# Patient Record
Sex: Female | Born: 1970 | Race: White | Hispanic: No | Marital: Married | Smoking: Never smoker
Health system: Southern US, Community
[De-identification: ages and names within clinical notes are randomized; demographics above are authoritative.]

## PROBLEM LIST (undated history)

## (undated) DIAGNOSIS — B977 Papillomavirus as the cause of diseases classified elsewhere: Secondary | ICD-10-CM

## (undated) DIAGNOSIS — A499 Bacterial infection, unspecified: Secondary | ICD-10-CM

## (undated) DIAGNOSIS — A630 Anogenital (venereal) warts: Secondary | ICD-10-CM

## (undated) DIAGNOSIS — O09529 Supervision of elderly multigravida, unspecified trimester: Secondary | ICD-10-CM

## (undated) DIAGNOSIS — N979 Female infertility, unspecified: Secondary | ICD-10-CM

## (undated) DIAGNOSIS — B379 Candidiasis, unspecified: Secondary | ICD-10-CM

## (undated) DIAGNOSIS — Z8619 Personal history of other infectious and parasitic diseases: Secondary | ICD-10-CM

## (undated) DIAGNOSIS — IMO0002 Reserved for concepts with insufficient information to code with codable children: Secondary | ICD-10-CM

## (undated) DIAGNOSIS — Z87440 Personal history of urinary (tract) infections: Secondary | ICD-10-CM

## (undated) HISTORY — DX: Female infertility, unspecified: N97.9

## (undated) HISTORY — DX: Reserved for concepts with insufficient information to code with codable children: IMO0002

## (undated) HISTORY — DX: Supervision of elderly multigravida, unspecified trimester: O09.529

## (undated) HISTORY — PX: LEEP: SHX91

## (undated) HISTORY — DX: Candidiasis, unspecified: B37.9

## (undated) HISTORY — PX: WISDOM TOOTH EXTRACTION: SHX21

## (undated) HISTORY — PX: DILATION AND CURETTAGE OF UTERUS: SHX78

## (undated) HISTORY — DX: Personal history of other infectious and parasitic diseases: Z86.19

## (undated) HISTORY — DX: Papillomavirus as the cause of diseases classified elsewhere: B97.7

## (undated) HISTORY — DX: Bacterial infection, unspecified: A49.9

## (undated) HISTORY — DX: Personal history of urinary (tract) infections: Z87.440

## (undated) HISTORY — DX: Anogenital (venereal) warts: A63.0

---

## 1989-11-19 DIAGNOSIS — IMO0002 Reserved for concepts with insufficient information to code with codable children: Secondary | ICD-10-CM

## 1989-11-19 DIAGNOSIS — R87619 Unspecified abnormal cytological findings in specimens from cervix uteri: Secondary | ICD-10-CM

## 1989-11-19 HISTORY — DX: Unspecified abnormal cytological findings in specimens from cervix uteri: R87.619

## 1989-11-19 HISTORY — DX: Reserved for concepts with insufficient information to code with codable children: IMO0002

## 1998-06-01 ENCOUNTER — Ambulatory Visit (HOSPITAL_COMMUNITY): Admission: RE | Admit: 1998-06-01 | Discharge: 1998-06-01 | Payer: Self-pay | Admitting: Orthopedic Surgery

## 2002-12-22 ENCOUNTER — Other Ambulatory Visit: Admission: RE | Admit: 2002-12-22 | Discharge: 2002-12-22 | Payer: Self-pay | Admitting: Obstetrics and Gynecology

## 2007-11-09 ENCOUNTER — Inpatient Hospital Stay (HOSPITAL_COMMUNITY): Admission: AD | Admit: 2007-11-09 | Discharge: 2007-11-12 | Payer: Self-pay | Admitting: Obstetrics

## 2008-03-30 ENCOUNTER — Emergency Department (HOSPITAL_COMMUNITY): Admission: EM | Admit: 2008-03-30 | Discharge: 2008-03-30 | Payer: Self-pay | Admitting: Emergency Medicine

## 2009-11-10 ENCOUNTER — Ambulatory Visit: Payer: Self-pay | Admitting: Obstetrics and Gynecology

## 2009-12-05 ENCOUNTER — Ambulatory Visit: Payer: Self-pay | Admitting: Obstetrics and Gynecology

## 2010-02-01 ENCOUNTER — Ambulatory Visit: Payer: Self-pay | Admitting: Obstetrics and Gynecology

## 2010-02-23 ENCOUNTER — Ambulatory Visit: Payer: Self-pay | Admitting: Obstetrics and Gynecology

## 2010-02-28 ENCOUNTER — Ambulatory Visit: Payer: Self-pay | Admitting: Obstetrics and Gynecology

## 2010-03-03 ENCOUNTER — Ambulatory Visit: Payer: Self-pay | Admitting: Gynecology

## 2010-03-27 ENCOUNTER — Ambulatory Visit: Payer: Self-pay | Admitting: Obstetrics and Gynecology

## 2010-03-30 ENCOUNTER — Ambulatory Visit: Payer: Self-pay | Admitting: Obstetrics and Gynecology

## 2010-09-28 ENCOUNTER — Ambulatory Visit: Payer: Self-pay | Admitting: Women's Health

## 2010-12-10 ENCOUNTER — Encounter: Payer: Self-pay | Admitting: Internal Medicine

## 2011-04-03 NOTE — Op Note (Signed)
[name] [name], [name] [name]                ACCOUNT NO.:  [id number]   MEDICAL RECORD NO.:  [id number]          PATIENT TYPE:  INP   LOCATION:  9167                          FACILITY:  [facility]   PHYSICIAN:  Maxie Better, M.D.DATE OF BIRTH:  03-Feb-1971   DATE OF PROCEDURE:  11/09/2007  DATE OF DISCHARGE:                               OPERATIVE REPORT   PREOPERATIVE DIAGNOSES:  1. Fetal intolerance to labor.  2. Nonreassuring tracing.   PROCEDURE:  Urgent primary cesarean section, Kerr hysterotomy.   POSTOPERATIVE DIAGNOSES:  1. Fetal intolerance to labor.  2. Nonreassuring fetal tracing.   ANESTHESIA:  Epidural.   SURGEON:  Maxie Better, M.D.   ASSISTANT:  Marlinda Mike, C.N.M.   PROCEDURE:  Under adequate epidural anesthesia, the patient was placed  in the supine position with a left lateral tilt.  She was sterilely  prepped and draped in the usual fashion.  An indwelling Foley catheter  was already in place.  The patient had 0.25% Marcaine injected along the  planned Pfannenstiel skin incision site.  A Pfannenstiel skin incision  was then made, carried down to the rectus fascia.  The rectus fascia was  opened transversely.  Rectus fascia then bluntly and sharply dissected  off the rectus muscle in superior and inferior fashion.  The rectus  muscles split in the midline.  The parietal peritoneum was entered  bluntly and extended.  A well-developed lower uterine segment was noted.  The vesicouterine peritoneum was opened transversely.  The bladder was  then bluntly dissected off the lower uterine segment and displaced  inferiorly with the bladder retractor.  A curvilinear low transverse  uterine incision was then made and extended with bandage scissors.  Subsequent delivery of a live female from the left occiput posterior  position was accomplished with a cord around the neck x2, which was  subsequently reduced.  The cord was clamped and cut and the baby was  transferred to the  awaiting pediatrician, who assigned Apgars of 8 and 9  at one and five minutes.  Placenta was spontaneous intact and not sent.  Uterine cavity was cleaned of debris.  Uterine incision had no  extension.  Uterine incision was closed in two layers, the first layer  of 0 Monocryl running locked stitch, the second layer was imbricated  using 0 Monocryl suture.  Good hemostasis noted.  The patient was a  private cord blood bank donor and the cord blood for banking was  obtained following the direction of the package from the patient.  Normal tubes and ovaries were noted bilaterally.  A small subserosal  about 2-cm anterior fibroid was noted.  The abdomen was copiously  irrigated and suctioned of debris.  The parietal peritoneum was closed  with 2-0 Vicryl, the rectus fascia was closed with 0 Vicryl x2.  The  subcutaneous area was irrigated and small bleeders cauterized.  Interrupted 2-0 plain sutures were then placed in the subcutaneous area  and the skin approximated using Ethicon staples.   SPECIMENS:  Placenta, not sent.   ESTIMATED BLOOD LOSS:  500 mL.   INTRAOPERATIVE FLUID:  1500 mL.   URINE OUTPUT:  100 mL clear yellow urine.   Sponge and instrument counts x2 was correct.  Cord pH was obtained,  which was 7.28.  Weight of the baby was 6 pounds 6 ounces.  The patient  tolerated procedure well and was transferred to the recovery room in  stable condition.      Maxie Better, M.D.  Electronically Signed     [place]/MEDQ  D:  11/09/2007  T:  11/10/2007  Job:  [id number]

## 2011-04-06 NOTE — Discharge Summary (Signed)
[name] [name], [name]                ACCOUNT NO.:  [id number]   MEDICAL RECORD NO.:  [id number]          PATIENT TYPE:  INP   LOCATION:  9128                          FACILITY:  [facility]   PHYSICIAN:  Maxie Better, M.D.DATE OF BIRTH:  Aug 03, 1971   DATE OF ADMISSION:  11/09/2007  DATE OF DISCHARGE:  11/12/2007                               DISCHARGE SUMMARY   ADMISSION DIAGNOSES:  1. Spontaneous rupture of membranes.  2. Term gestation.   DISCHARGE DIAGNOSES:  1. Term gestation, delivered.  2. Fetal intolerance to labor.  3. Nonreassuring fetal tracing.  4. Subserosal fibroid.   PROCEDURE:  Primary cesarean section.Marland Kitchen   HOSPITAL COURSE:  The patient was admitted as a 40 year old gravida 3,  para 0-0-2-0 female at 79 weeks with spontaneous rupture of membranes.  On presentation the patient's cervix was fingertip, 40%, vertex  presentation.  Ultrasound performed revealed a vertex, oligohydramnios.  Group B strep culture was negative.  The patient was having some  moderate variable deceleration down to the 80s and contractions were  every 15 minutes.  Amnioinfusion was planned, as well as attempted  induction of labor.  The patient was transferred to labor and delivery.  She was placed on continuous monitoring.  During the course of her labor  the patient continued to have some variable decelerations.  Amnioinfusion was ultimately started.  Pitocin augmentation was done.  Intrauterine pressure catheter had been placed as well as internal scalp  electrode.  Despite several attempts at Pitocin augmentation,  decelerations were noted which did not allow for labor to advance.  Decision was made to proceed with a primary cesarean section.  Primary  cesarean section was performed for nonreassuring fetal tracing.  Procedure resulted in delivery of a live female from the left occiput  posterior presentation weighing 6 pounds 6 ounces with a cord around the  neck x2, Apgars of 8 and 9.  The  patient had an uncomplicated  postoperative course.  By postoperative day #3 she was tolerating a  regular diet, had passed flatus.  Incision had no evidence of infection.  Her CBC on postoperative day #1 showed a hemoglobin of 11.0; hematocrit  31.1; white count of 9.7; platelet count of 215,000.  She was deemed  well to be discharged home.   DISPOSITION:  Home.   CONDITION:  Stable.   DISCHARGE MEDICATIONS:  1. Percocet one to two tablets every 4-6 hours p.r.n. pain.  2. Prenatal vitamins one p.o. daily.   DISCHARGE INSTRUCTIONS:  Per the postpartum booklet given.   FOLLOWUP APPOINTMENT:  At [facility] in 6 weeks.      Maxie Better, M.D.  Electronically Signed     [place]/MEDQ  D:  12/14/2007  T:  12/14/2007  Job:  [id number]

## 2011-05-12 ENCOUNTER — Inpatient Hospital Stay (HOSPITAL_COMMUNITY)
Admission: AD | Admit: 2011-05-12 | Discharge: 2011-05-12 | Disposition: A | Discharge status: Home / Self Care | Payer: Medicaid Other | Source: Ambulatory Visit | Attending: Obstetrics and Gynecology | Admitting: Obstetrics and Gynecology

## 2011-05-12 DIAGNOSIS — O479 False labor, unspecified: Secondary | ICD-10-CM | POA: Insufficient documentation

## 2011-05-24 ENCOUNTER — Inpatient Hospital Stay (HOSPITAL_COMMUNITY)
Admission: AD | Admit: 2011-05-24 | Discharge: 2011-05-25 | DRG: 775 | Disposition: A | Discharge status: Home / Self Care | Payer: Medicaid Other | Source: Ambulatory Visit | Attending: Obstetrics and Gynecology | Admitting: Obstetrics and Gynecology

## 2011-05-24 ENCOUNTER — Inpatient Hospital Stay (HOSPITAL_COMMUNITY)
Admission: AD | Admit: 2011-05-24 | Payer: Medicaid Other | Source: Ambulatory Visit | Admitting: Obstetrics and Gynecology

## 2011-05-24 DIAGNOSIS — O34219 Maternal care for unspecified type scar from previous cesarean delivery: Secondary | ICD-10-CM | POA: Diagnosis present

## 2011-05-24 LAB — CBC
Hemoglobin: 12.7 g/dL (ref 12.0–15.0)
MCHC: 34.2 g/dL (ref 30.0–36.0)
RDW: 13.7 % (ref 11.5–15.5)

## 2011-05-24 LAB — RPR: RPR Ser Ql: NONREACTIVE

## 2011-05-25 LAB — CBC
HCT: 30.8 % — ABNORMAL LOW (ref 36.0–46.0)
Hemoglobin: 10.1 g/dL — ABNORMAL LOW (ref 12.0–15.0)
MCHC: 32.8 g/dL (ref 30.0–36.0)
MCV: 91.9 fL (ref 78.0–100.0)
RDW: 14.1 % (ref 11.5–15.5)

## 2011-08-24 LAB — CBC
HCT: 31.1 — ABNORMAL LOW
Hemoglobin: 12.5
MCHC: 35.5
MCV: 92.5
MCV: 92.6
Platelets: 215
RBC: 3.79 — ABNORMAL LOW
RDW: 12.9
RDW: 13.4

## 2011-08-24 LAB — RPR: RPR Ser Ql: NONREACTIVE

## 2011-11-20 NOTE — L&D Delivery Note (Signed)
Delivery Note At 3:34 PM a viable female was delivered via Vaginal, in L side lying position. Spontaneous Delivery (Presentation: ROA, Occiput Anterior).  Easy delivery of the head, loose nuchal cord x 1 slipped, easly delivery of the shoulders,bulb suction mouth and nares, baby placed on pt abdomen cord doubly clamped and cut. APGAR: 7, 7; weight pending. Placenta status: Intact, Spontaneous, schultze.  Cord: 3 vessels  Anesthesia: Epidural  Episiotomy: None Lacerations: 2nd degree;Perineal Suture Repair: 3.0 monocryl Est. Blood Loss (mL): 200  Mom to postpartum.  Baby to rooming in.  Deette Revak 08/21/2012, 4:16 PM

## 2012-01-02 ENCOUNTER — Other Ambulatory Visit: Payer: Self-pay | Admitting: Obstetrics and Gynecology

## 2012-01-02 DIAGNOSIS — O09529 Supervision of elderly multigravida, unspecified trimester: Secondary | ICD-10-CM

## 2012-01-02 DIAGNOSIS — O3680X0 Pregnancy with inconclusive fetal viability, not applicable or unspecified: Secondary | ICD-10-CM

## 2012-01-08 ENCOUNTER — Encounter (HOSPITAL_COMMUNITY): Payer: Self-pay | Admitting: Obstetrics and Gynecology

## 2012-01-16 ENCOUNTER — Encounter (HOSPITAL_COMMUNITY): Payer: Self-pay

## 2012-01-16 ENCOUNTER — Ambulatory Visit (HOSPITAL_COMMUNITY)
Admission: RE | Admit: 2012-01-16 | Discharge: 2012-01-16 | Disposition: A | Discharge status: Home / Self Care | Payer: Medicaid Other | Source: Ambulatory Visit | Attending: Obstetrics and Gynecology | Admitting: Obstetrics and Gynecology

## 2012-01-16 DIAGNOSIS — O3680X0 Pregnancy with inconclusive fetal viability, not applicable or unspecified: Secondary | ICD-10-CM

## 2012-01-16 DIAGNOSIS — O09529 Supervision of elderly multigravida, unspecified trimester: Secondary | ICD-10-CM

## 2012-01-16 DIAGNOSIS — O3440 Maternal care for other abnormalities of cervix, unspecified trimester: Secondary | ICD-10-CM | POA: Insufficient documentation

## 2012-01-16 DIAGNOSIS — O34219 Maternal care for unspecified type scar from previous cesarean delivery: Secondary | ICD-10-CM | POA: Insufficient documentation

## 2012-01-16 NOTE — Progress Notes (Signed)
Genetic Counseling  High-Risk Gestation Note  Appointment Date:  01/16/2012 Referred By: Hal Morales, MD Date of Birth:  June 02, 1971  Pregnancy History: [id number] Estimated Date of Delivery: 08/31/12 Estimated Gestational Age: [redacted]w[redacted]d Attending: Particia Nearing, MD   [name] was seen for genetic counseling because of a maternal age of 41 y.o..     She was counseled regarding maternal age and the association with risk for chromosome conditions due to nondisjunction with aging of the ova.   We reviewed chromosomes, nondisjunction, and the associated 1 in 16 risk for fetal aneuploidy related to a maternal age of 41 y.o. at [redacted]w[redacted]d gestation.  She was counseled that the risk for aneuploidy decreases as gestational age increases, accounting for those pregnancies which spontaneously abort.  We specifically discussed Down syndrome (trisomy 4), trisomies 42 and 9, and sex chromosome aneuploidies (47,XXX and 47,XXY) including the common features and prognoses of each.   We reviewed available screening and diagnostic options.  Regarding screening tests, we discussed the options of First screen, Quad screen and ultrasound.  In addition, we discussed another type of screening test, noninvasive prenatal testing (NIPT), which utilizes cell free fetal DNA found in the maternal circulation. This test is not diagnostic for chromosome conditions, but can provide information regarding the presence or absence of extra fetal DNA for chromosomes 13, 18 and 21. Thus, it would not identify or rule out all fetal aneuploidy. The reported detection rate is greater than 99% for Trisomy 21, greater than 97% for Trisomy 18, and is approximately 80% (8 out of 10) for Trisomy 13. The false positive rate is thought to be less than 1% for any of these conditions.  She understands that screening tests are used to modify a patient's a priori risk for aneuploidy, typically based on age.  This estimate provides a pregnancy  specific risk assessment.  We also reviewed the availability of diagnostic options including CVS and amniocentesis.  We discussed the risks, limitations, and benefits of each.  After thoughtful consideration of these options, [name] [name] was undecided regarding her options.  She was provided with brochures regarding these options.  She agreed to contact our office when she has made a decision so that she can be scheduled appropriately.  [name] [name] was provided with written information regarding cystic fibrosis (CF) including the carrier frequency and incidence in the Caucasian population, the availability of carrier testing and prenatal diagnosis if indicated.  In addition, we discussed that CF is routinely screened for as part of the [place] newborn screening panel.  She declined testing today.   Both family histories were reviewed and found to be noncontributory for birth defects, mental retardation, and known genetic conditions. Without further information regarding the provided family history, an accurate genetic risk cannot be calculated. Further genetic counseling is warranted if more information is obtained.  [name] [name] denied exposure to environmental toxins or chemical agents. She denied the use of alcohol, tobacco or street drugs. She denied significant viral illnesses during the course of her pregnancy. Her medical and surgical histories were noncontributory.   I counseled [name] [name] regarding the above risks and available options.  The approximate face-to-face time with the genetic counselor was 55 minutes.  [name] Prose, MS  Certified Genetic Counselor

## 2012-01-25 ENCOUNTER — Other Ambulatory Visit (HOSPITAL_COMMUNITY): Payer: Self-pay | Admitting: Obstetrics and Gynecology

## 2012-01-25 DIAGNOSIS — Z3682 Encounter for antenatal screening for nuchal translucency: Secondary | ICD-10-CM

## 2012-02-05 ENCOUNTER — Encounter (INDEPENDENT_AMBULATORY_CARE_PROVIDER_SITE_OTHER): Payer: Medicaid Other | Admitting: Obstetrics and Gynecology

## 2012-02-05 DIAGNOSIS — O3441 Maternal care for other abnormalities of cervix, first trimester: Secondary | ICD-10-CM | POA: Insufficient documentation

## 2012-02-05 DIAGNOSIS — Z0000 Encounter for general adult medical examination without abnormal findings: Secondary | ICD-10-CM

## 2012-02-05 DIAGNOSIS — Z8742 Personal history of other diseases of the female genital tract: Secondary | ICD-10-CM | POA: Insufficient documentation

## 2012-02-05 DIAGNOSIS — O039 Complete or unspecified spontaneous abortion without complication: Secondary | ICD-10-CM | POA: Insufficient documentation

## 2012-02-05 DIAGNOSIS — Z98891 History of uterine scar from previous surgery: Secondary | ICD-10-CM | POA: Insufficient documentation

## 2012-02-05 DIAGNOSIS — Z331 Pregnant state, incidental: Secondary | ICD-10-CM

## 2012-02-05 DIAGNOSIS — N898 Other specified noninflammatory disorders of vagina: Secondary | ICD-10-CM

## 2012-02-05 LAB — OB RESULTS CONSOLE HEPATITIS B SURFACE ANTIGEN: Hepatitis B Surface Ag: NEGATIVE

## 2012-02-05 LAB — OB RESULTS CONSOLE ABO/RH: RH Type: POSITIVE

## 2012-02-05 LAB — OB RESULTS CONSOLE RPR: RPR: NONREACTIVE

## 2012-02-05 LAB — OB RESULTS CONSOLE HIV ANTIBODY (ROUTINE TESTING): HIV: NONREACTIVE

## 2012-02-05 LAB — OB RESULTS CONSOLE GC/CHLAMYDIA: Gonorrhea: NEGATIVE

## 2012-02-05 LAB — OB RESULTS CONSOLE RUBELLA ANTIBODY, IGM: Rubella: IMMUNE

## 2012-02-12 ENCOUNTER — Other Ambulatory Visit: Payer: Self-pay

## 2012-02-13 ENCOUNTER — Ambulatory Visit: Payer: Medicaid Other | Attending: Obstetrics and Gynecology

## 2012-02-13 DIAGNOSIS — M542 Cervicalgia: Secondary | ICD-10-CM | POA: Insufficient documentation

## 2012-02-13 DIAGNOSIS — R5381 Other malaise: Secondary | ICD-10-CM | POA: Insufficient documentation

## 2012-02-13 DIAGNOSIS — IMO0001 Reserved for inherently not codable concepts without codable children: Secondary | ICD-10-CM | POA: Insufficient documentation

## 2012-02-13 DIAGNOSIS — M25559 Pain in unspecified hip: Secondary | ICD-10-CM | POA: Insufficient documentation

## 2012-02-14 ENCOUNTER — Ambulatory Visit (HOSPITAL_COMMUNITY)
Admission: RE | Admit: 2012-02-14 | Discharge: 2012-02-14 | Disposition: A | Discharge status: Home / Self Care | Payer: Medicaid Other | Source: Ambulatory Visit | Attending: Obstetrics and Gynecology | Admitting: Obstetrics and Gynecology

## 2012-02-14 DIAGNOSIS — O09529 Supervision of elderly multigravida, unspecified trimester: Secondary | ICD-10-CM | POA: Insufficient documentation

## 2012-02-14 DIAGNOSIS — O3510X0 Maternal care for (suspected) chromosomal abnormality in fetus, unspecified, not applicable or unspecified: Secondary | ICD-10-CM | POA: Insufficient documentation

## 2012-02-14 DIAGNOSIS — O3440 Maternal care for other abnormalities of cervix, unspecified trimester: Secondary | ICD-10-CM | POA: Insufficient documentation

## 2012-02-14 DIAGNOSIS — O34219 Maternal care for unspecified type scar from previous cesarean delivery: Secondary | ICD-10-CM | POA: Insufficient documentation

## 2012-02-14 DIAGNOSIS — Z3682 Encounter for antenatal screening for nuchal translucency: Secondary | ICD-10-CM

## 2012-02-14 DIAGNOSIS — Z3689 Encounter for other specified antenatal screening: Secondary | ICD-10-CM | POA: Insufficient documentation

## 2012-02-14 DIAGNOSIS — O351XX0 Maternal care for (suspected) chromosomal abnormality in fetus, not applicable or unspecified: Secondary | ICD-10-CM | POA: Insufficient documentation

## 2012-02-17 ENCOUNTER — Telehealth: Payer: Self-pay | Admitting: Obstetrics and Gynecology

## 2012-02-17 DIAGNOSIS — O09529 Supervision of elderly multigravida, unspecified trimester: Secondary | ICD-10-CM | POA: Insufficient documentation

## 2012-02-17 NOTE — Telephone Encounter (Signed)
12 weeks, onset N/V this am.  Ate normal meal at home last night, no one else is sick. No diarrhea at present, no fever.  Keeping fluids down.  Plan: Recommend CTO, with NPO except for fluids, chips/sips. Call if symptoms don't abate as the day progresses or if symptoms worsen. Reassured patient regarding safety of baby. Offered eval in MAU as needed.  Nigel Bridgeman, CNM, [place] 02/17/12 11am

## 2012-02-20 ENCOUNTER — Ambulatory Visit: Payer: Medicaid Other | Attending: Obstetrics and Gynecology | Admitting: Physical Therapy

## 2012-02-20 DIAGNOSIS — M542 Cervicalgia: Secondary | ICD-10-CM | POA: Insufficient documentation

## 2012-02-20 DIAGNOSIS — R5381 Other malaise: Secondary | ICD-10-CM | POA: Insufficient documentation

## 2012-02-20 DIAGNOSIS — M25559 Pain in unspecified hip: Secondary | ICD-10-CM | POA: Insufficient documentation

## 2012-02-20 DIAGNOSIS — IMO0001 Reserved for inherently not codable concepts without codable children: Secondary | ICD-10-CM | POA: Insufficient documentation

## 2012-02-27 ENCOUNTER — Telehealth: Payer: Self-pay

## 2012-02-27 ENCOUNTER — Telehealth (HOSPITAL_COMMUNITY): Payer: Self-pay

## 2012-02-27 NOTE — Telephone Encounter (Signed)
Left message for patient to return call regarding MaterniT21 results. Donald Prose, MS Certified Genetic Counselor

## 2012-02-27 NOTE — Telephone Encounter (Signed)
Tc to pt rgd sch colpo

## 2012-02-28 ENCOUNTER — Ambulatory Visit: Payer: Medicaid Other | Admitting: Physical Therapy

## 2012-03-04 ENCOUNTER — Encounter: Payer: Medicaid Other | Admitting: Obstetrics and Gynecology

## 2012-03-05 ENCOUNTER — Encounter: Payer: Medicaid Other | Admitting: Physical Therapy

## 2012-03-06 ENCOUNTER — Encounter: Payer: Medicaid Other | Admitting: Obstetrics and Gynecology

## 2012-03-06 NOTE — Telephone Encounter (Signed)
Called [name] [name] to discuss her MaterniT21, cell free fetal DNA testing.  We reviewed that these are within normal limits, showing an expected representation of chromosomes 21, 18 and 13 as well as Y chromosome material, consistent with a female fetus.  We reviewed that this testing identifies > 99% of pregnancies with trisomy 66 and trisomy 19, and >91.7% with trisomy 85; the false positive rate is <1% for all conditions.  She understands that this testing does not identify all genetic conditions.  All questions were answered to her satisfaction, she was encouraged to call with additional questions or concerns.  Despina Arias, MS Certified Genetic Counselor

## 2012-03-11 ENCOUNTER — Encounter: Payer: Self-pay | Admitting: Obstetrics and Gynecology

## 2012-03-11 ENCOUNTER — Ambulatory Visit (INDEPENDENT_AMBULATORY_CARE_PROVIDER_SITE_OTHER): Payer: Medicaid Other | Admitting: Obstetrics and Gynecology

## 2012-03-11 VITALS — BP 100/58 | Wt 177.0 lb

## 2012-03-11 DIAGNOSIS — Z331 Pregnant state, incidental: Secondary | ICD-10-CM

## 2012-03-11 NOTE — Progress Notes (Signed)
Leeroy Bock [redacted]w[redacted]d   Doing well No ctx, VB or LOF Pt receiving PT for hip and shoulder pain, plan 1x/week for 5weeks per consult note Pt reports relief of pan with PT - c/o L hip pain and L shoulder pain Pt denies any nausea/vomiting, admits to increased activity taking care of 2 children wgt loss to date 10lbs,  Plan:  RV'd pap results, needs colpo LGSIL, HPV/CIN 1 Pt had normal harmony aneuploidy testing, received after genetic counceling  Anat [place] supplement with boost/ensure daily

## 2012-03-11 NOTE — Patient Instructions (Signed)
High Protein Diet A high protein diet means that high protein foods are added to your diet. Getting more protein in the diet is important for a number of reasons. Protein helps the body to build tissue, muscle, and to repair damage. People who have had surgery, injuries such as broken bones, infections, and burns, or illnesses such as cancer, may need more protein in their diet.  SERVING SIZES Measuring foods and serving sizes helps to make sure you are getting the right amount of food. The list below tells how big or small some common serving sizes are.   1 oz.........4 stacked dice.   3 oz.........Deck of cards.   1 tsp........Tip of little finger.   1 tbs........Thumb.   2 tbs........Golf ball.    cup.......Half of a fist.   1 cup........A fist.  FOOD SOURCES OF PROTEIN Listed below are some food sources of protein and the amount of protein they contain. Your Registered Dietitian can calculate how many grams of protein you need for your medical condition. High protein foods can be added to the diet at mealtime or as snacks. Be sure to have at least 1 protein-containing food at each meal and snack to ensure adequate intake.  Meats and Meat Substitutes / Protein (g)  3 oz poultry (chicken, turkey) / 26 g   3 oz tuna, canned in water / 26 g   3 oz fish (cod) / 21 g   3 oz red meat (beef, pork) / 21 g   4 oz tofu / 9 g   1 egg / 6 g    cup egg substitute / 5 g   1 cup dried beans / 15 g   1 cup soy milk / 4 g  Dairy / Protein (g)  1 cup milk (skim, 1%, 2%, whole) / 8 g    cup evaporated milk / 9 g   1 cup buttermilk / 8 g   1 cup low-fat plain yogurt / 11 g   1 cup regular plain yogurt / 9 g    cup cottage cheese / 14 g   1 oz cheddar cheese / 7 g  Nuts / Protein (g)  2 tbs peanut butter / 8 g   1 oz peanuts / 7 g   2 tbs cashews / 5 g   2 tbs almonds / 5 g  Document Released: 11/05/2005 Document Revised: 10/25/2011 Document Reviewed:  08/08/2007 [employer] Patient Information 2012 [employer]. 

## 2012-04-07 DIAGNOSIS — Z98891 History of uterine scar from previous surgery: Secondary | ICD-10-CM

## 2012-04-07 DIAGNOSIS — O3441 Maternal care for other abnormalities of cervix, first trimester: Secondary | ICD-10-CM

## 2012-04-07 DIAGNOSIS — O039 Complete or unspecified spontaneous abortion without complication: Secondary | ICD-10-CM

## 2012-04-07 DIAGNOSIS — Z8742 Personal history of other diseases of the female genital tract: Secondary | ICD-10-CM

## 2012-04-08 ENCOUNTER — Ambulatory Visit (INDEPENDENT_AMBULATORY_CARE_PROVIDER_SITE_OTHER): Payer: Medicaid Other

## 2012-04-08 ENCOUNTER — Ambulatory Visit (INDEPENDENT_AMBULATORY_CARE_PROVIDER_SITE_OTHER): Payer: Medicaid Other | Admitting: Obstetrics and Gynecology

## 2012-04-08 ENCOUNTER — Other Ambulatory Visit: Payer: Self-pay | Admitting: Obstetrics and Gynecology

## 2012-04-08 ENCOUNTER — Encounter: Payer: Self-pay | Admitting: Obstetrics and Gynecology

## 2012-04-08 VITALS — BP 118/60 | Ht 66.0 in | Wt 180.0 lb

## 2012-04-08 DIAGNOSIS — Z3689 Encounter for other specified antenatal screening: Secondary | ICD-10-CM

## 2012-04-08 DIAGNOSIS — Z331 Pregnant state, incidental: Secondary | ICD-10-CM

## 2012-04-08 DIAGNOSIS — Z3490 Encounter for supervision of normal pregnancy, unspecified, unspecified trimester: Secondary | ICD-10-CM

## 2012-04-08 DIAGNOSIS — O09529 Supervision of elderly multigravida, unspecified trimester: Secondary | ICD-10-CM

## 2012-04-09 NOTE — Progress Notes (Signed)
Pt left the office after [place] but before being seen by MD Maternity 21 screen neg Ultrasound shows:  SIUP  S=D     [place] EDD: 09/01/12            AFI: nl           Cervical length: 4.8 cm           Placenta localization: posterior           Fetal presentation: variable                   Anatomy survey is normal but incomplete.  Non visualization on LVOT an d4 chamber heart           Gender : female

## 2012-04-22 ENCOUNTER — Telehealth: Payer: Self-pay | Admitting: Obstetrics and Gynecology

## 2012-04-22 NOTE — Telephone Encounter (Signed)
cht found/at check in

## 2012-04-23 ENCOUNTER — Ambulatory Visit (INDEPENDENT_AMBULATORY_CARE_PROVIDER_SITE_OTHER): Payer: Medicaid Other | Admitting: Obstetrics and Gynecology

## 2012-04-23 ENCOUNTER — Encounter: Payer: Self-pay | Admitting: Obstetrics and Gynecology

## 2012-04-23 VITALS — BP 100/60 | Ht 66.0 in | Wt 181.5 lb

## 2012-04-23 DIAGNOSIS — O09529 Supervision of elderly multigravida, unspecified trimester: Secondary | ICD-10-CM

## 2012-04-23 NOTE — Progress Notes (Signed)
Pt voices no complaints today. 1)concerned about lack of weight gain.  Total weight gain this pregnancy 3 pound.  Size equal to dates. Plan repeat ultrasound in third trimester if bleeding remains poor 2) one hour Glucola next visit

## 2012-05-09 LAB — US OB COMP + 14 WK

## 2012-06-04 ENCOUNTER — Other Ambulatory Visit: Payer: Medicaid Other

## 2012-06-04 ENCOUNTER — Encounter: Payer: Medicaid Other | Admitting: Obstetrics and Gynecology

## 2012-06-06 ENCOUNTER — Encounter: Payer: Self-pay | Admitting: Obstetrics and Gynecology

## 2012-06-06 ENCOUNTER — Other Ambulatory Visit: Payer: Medicaid Other

## 2012-06-06 ENCOUNTER — Ambulatory Visit (INDEPENDENT_AMBULATORY_CARE_PROVIDER_SITE_OTHER): Payer: Medicaid Other | Admitting: Obstetrics and Gynecology

## 2012-06-06 VITALS — BP 98/62 | Wt 190.0 lb

## 2012-06-06 DIAGNOSIS — Z331 Pregnant state, incidental: Secondary | ICD-10-CM

## 2012-06-06 DIAGNOSIS — Z3490 Encounter for supervision of normal pregnancy, unspecified, unspecified trimester: Secondary | ICD-10-CM

## 2012-06-06 LAB — HEMOGLOBIN: Hemoglobin: 11.7 g/dL — ABNORMAL LOW (ref 12.0–15.0)

## 2012-06-06 NOTE — Progress Notes (Signed)
1 gtt given today without difficulty  Pt excited about her wt gain

## 2012-06-06 NOTE — Progress Notes (Signed)
[redacted]w[redacted]d No complaints 1 hr GTT today. ROB x4 weeks

## 2012-06-07 LAB — GLUCOSE TOLERANCE, 1 HOUR (50G) W/O FASTING: Glucose, 1 Hour GTT: 101 mg/dL (ref 70–140)

## 2012-06-07 LAB — RPR

## 2012-06-20 ENCOUNTER — Encounter: Payer: Medicaid Other | Admitting: Obstetrics and Gynecology

## 2012-06-24 ENCOUNTER — Ambulatory Visit (INDEPENDENT_AMBULATORY_CARE_PROVIDER_SITE_OTHER): Payer: Medicaid Other | Admitting: Obstetrics and Gynecology

## 2012-06-24 VITALS — BP 98/68 | Wt 190.0 lb

## 2012-06-24 DIAGNOSIS — Z3490 Encounter for supervision of normal pregnancy, unspecified, unspecified trimester: Secondary | ICD-10-CM

## 2012-06-24 DIAGNOSIS — Z331 Pregnant state, incidental: Secondary | ICD-10-CM

## 2012-06-24 DIAGNOSIS — T148XXA Other injury of unspecified body region, initial encounter: Secondary | ICD-10-CM

## 2012-06-24 DIAGNOSIS — W57XXXA Bitten or stung by nonvenomous insect and other nonvenomous arthropods, initial encounter: Secondary | ICD-10-CM | POA: Insufficient documentation

## 2012-06-24 HISTORY — DX: Bitten or stung by nonvenomous insect and other nonvenomous arthropods, initial encounter: W57.XXXA

## 2012-06-24 NOTE — Progress Notes (Signed)
Pt bitten by deer tick 1st wk in July no bullseye  06/06/12 1 gtt 101 Hemoglobin 11.7 RPR NR

## 2012-06-24 NOTE — Progress Notes (Signed)
[redacted]w[redacted]d No complaints. Had been bitten by a deer tick 4 weeks ago. Would like to be tested for Lime Disease. Bloods for Lime Disease Testing today. ROB x 2 weeks.

## 2012-07-02 ENCOUNTER — Telehealth: Payer: Self-pay | Admitting: Obstetrics and Gynecology

## 2012-07-02 NOTE — Telephone Encounter (Signed)
Spoke with patient and advised of Negative result of Lyme's Disease Titer. Earl Gala, CNM.

## 2012-07-14 ENCOUNTER — Encounter: Payer: Self-pay | Admitting: Obstetrics and Gynecology

## 2012-07-14 ENCOUNTER — Ambulatory Visit (INDEPENDENT_AMBULATORY_CARE_PROVIDER_SITE_OTHER): Payer: Medicaid Other | Admitting: Obstetrics and Gynecology

## 2012-07-14 VITALS — BP 90/62 | Wt 193.0 lb

## 2012-07-14 DIAGNOSIS — O26849 Uterine size-date discrepancy, unspecified trimester: Secondary | ICD-10-CM

## 2012-07-14 DIAGNOSIS — Z3490 Encounter for supervision of normal pregnancy, unspecified, unspecified trimester: Secondary | ICD-10-CM

## 2012-07-14 DIAGNOSIS — Z331 Pregnant state, incidental: Secondary | ICD-10-CM

## 2012-07-14 NOTE — Addendum Note (Signed)
Addended by: Janeece Agee on: 07/14/2012 11:02 AM   Modules accepted: Orders

## 2012-07-14 NOTE — Progress Notes (Signed)
No concerns per pt 

## 2012-07-14 NOTE — Progress Notes (Signed)
Lyme Dx testing negative Concerned regarding size of baby, feels bigger than previous pregnancy S>D--plan [place] at [place]. GBS NV. Vtx to [place] today. Needs VBAC consent [place].

## 2012-07-29 ENCOUNTER — Ambulatory Visit (INDEPENDENT_AMBULATORY_CARE_PROVIDER_SITE_OTHER): Payer: Medicaid Other | Admitting: Obstetrics and Gynecology

## 2012-07-29 ENCOUNTER — Ambulatory Visit (INDEPENDENT_AMBULATORY_CARE_PROVIDER_SITE_OTHER): Payer: Medicaid Other

## 2012-07-29 VITALS — BP 102/64 | Wt 195.0 lb

## 2012-07-29 DIAGNOSIS — Z331 Pregnant state, incidental: Secondary | ICD-10-CM

## 2012-07-29 DIAGNOSIS — O26849 Uterine size-date discrepancy, unspecified trimester: Secondary | ICD-10-CM

## 2012-07-29 LAB — US OB FOLLOW UP

## 2012-07-29 NOTE — Progress Notes (Signed)
U/S: Growth. [redacted]w[redacted]d EFW 6lbs 11oz +/- oz AUA 74%tile LMP 85%tile.  Vertex presentation. Anterior placenta Upper normal fluid. AFI = 80th%tile. EFW = 85th%tile.  No late presenting fetal abnormality. Cx- Not measured.   Pt states she has no concerns today.  GBS today.

## 2012-07-29 NOTE — Progress Notes (Signed)
Patient ID: [name] [name], female   DOB: 1971-10-25, 41 y.o.   MRN: [id number] [redacted]w[redacted]d GBS sent Reviewed s/s uc, srom, vag bleeding, daily fetal kick counts to report, comfort measures. Encouragged 8 water daily and frequent voids. Lavera Guise, CNM

## 2012-07-31 LAB — STREP B DNA PROBE: GBSP: NEGATIVE

## 2012-08-08 ENCOUNTER — Ambulatory Visit (INDEPENDENT_AMBULATORY_CARE_PROVIDER_SITE_OTHER): Payer: Medicaid Other | Admitting: Obstetrics and Gynecology

## 2012-08-08 VITALS — BP 120/64 | Wt 198.0 lb

## 2012-08-08 DIAGNOSIS — Z331 Pregnant state, incidental: Secondary | ICD-10-CM

## 2012-08-08 DIAGNOSIS — Z3490 Encounter for supervision of normal pregnancy, unspecified, unspecified trimester: Secondary | ICD-10-CM

## 2012-08-08 NOTE — Progress Notes (Signed)
Office Visit on 07/29/2012  Component Date Value Range Status  . GBSP 07/29/2012 NEGATIVE   Final  No complaints Doing well FKCs and Labor precautions RTO 1wk

## 2012-08-14 ENCOUNTER — Encounter: Payer: Self-pay | Admitting: Obstetrics and Gynecology

## 2012-08-14 ENCOUNTER — Ambulatory Visit (INDEPENDENT_AMBULATORY_CARE_PROVIDER_SITE_OTHER): Payer: Medicaid Other | Admitting: Obstetrics and Gynecology

## 2012-08-14 VITALS — BP 98/68 | Wt 198.0 lb

## 2012-08-14 DIAGNOSIS — Z331 Pregnant state, incidental: Secondary | ICD-10-CM

## 2012-08-14 NOTE — Progress Notes (Signed)
Patient ID: [name] [name], female   DOB: 06/28/1971, 41 y.o.   MRN: [id number] [redacted]w[redacted]d GBS neg Reviewed s/s uc, srom, vag bleeding, daily fetal kick counts to report, comfort measures. Encouragged 8 water daily and frequent voids. Lavera Guise, CNM

## 2012-08-14 NOTE — Progress Notes (Signed)
Pt requests cervix check today.  

## 2012-08-21 ENCOUNTER — Encounter (HOSPITAL_COMMUNITY): Payer: Self-pay | Admitting: Anesthesiology

## 2012-08-21 ENCOUNTER — Inpatient Hospital Stay (HOSPITAL_COMMUNITY): Payer: Medicaid Other | Admitting: Anesthesiology

## 2012-08-21 ENCOUNTER — Encounter (HOSPITAL_COMMUNITY): Payer: Self-pay | Admitting: *Deleted

## 2012-08-21 ENCOUNTER — Inpatient Hospital Stay (HOSPITAL_COMMUNITY)
Admission: AD | Admit: 2012-08-21 | Discharge: 2012-08-23 | DRG: 775 | Disposition: A | Discharge status: Home / Self Care | Payer: Medicaid Other | Source: Ambulatory Visit | Attending: Obstetrics and Gynecology | Admitting: Obstetrics and Gynecology

## 2012-08-21 DIAGNOSIS — O09529 Supervision of elderly multigravida, unspecified trimester: Secondary | ICD-10-CM | POA: Diagnosis present

## 2012-08-21 DIAGNOSIS — IMO0001 Reserved for inherently not codable concepts without codable children: Secondary | ICD-10-CM

## 2012-08-21 DIAGNOSIS — O34219 Maternal care for unspecified type scar from previous cesarean delivery: Secondary | ICD-10-CM | POA: Diagnosis present

## 2012-08-21 DIAGNOSIS — D649 Anemia, unspecified: Secondary | ICD-10-CM | POA: Diagnosis not present

## 2012-08-21 DIAGNOSIS — O9903 Anemia complicating the puerperium: Secondary | ICD-10-CM | POA: Diagnosis not present

## 2012-08-21 LAB — TYPE AND SCREEN: Antibody Screen: NEGATIVE

## 2012-08-21 LAB — CBC
HCT: 37.3 % (ref 36.0–46.0)
Hemoglobin: 12.3 g/dL (ref 12.0–15.0)
MCV: 90.3 fL (ref 78.0–100.0)
WBC: 12 10*3/uL — ABNORMAL HIGH (ref 4.0–10.5)

## 2012-08-21 MED ORDER — ZOLPIDEM TARTRATE 5 MG PO TABS: 5.0000 mg | ORAL_TABLET | Freq: Every evening | ORAL | Status: DC | PRN

## 2012-08-21 MED ORDER — ACETAMINOPHEN 325 MG PO TABS: 650.0000 mg | ORAL_TABLET | ORAL | Status: DC | PRN

## 2012-08-21 MED ORDER — OXYCODONE-ACETAMINOPHEN 5-325 MG PO TABS: 1.0000 | ORAL_TABLET | ORAL | Status: DC | PRN

## 2012-08-21 MED ORDER — TETANUS-DIPHTH-ACELL PERTUSSIS 5-2.5-18.5 LF-MCG/0.5 IM SUSP
0.5000 mL | Freq: Once | INTRAMUSCULAR | Status: AC
  Administered 2012-08-22: 0.5 mL via INTRAMUSCULAR
  Filled 2012-08-21: qty 0.5

## 2012-08-21 MED ORDER — LACTATED RINGERS IV SOLN: 500.0000 mL | INTRAVENOUS | Status: DC | PRN

## 2012-08-21 MED ORDER — LACTATED RINGERS IV SOLN
500.0000 mL | Freq: Once | INTRAVENOUS | Status: AC
  Administered 2012-08-21: 500 mL via INTRAVENOUS

## 2012-08-21 MED ORDER — EPHEDRINE 5 MG/ML INJ: 10.0000 mg | INTRAVENOUS | Status: DC | PRN

## 2012-08-21 MED ORDER — EPHEDRINE 5 MG/ML INJ
10.0000 mg | INTRAVENOUS | Status: DC | PRN
  Filled 2012-08-21: qty 4

## 2012-08-21 MED ORDER — FENTANYL 2.5 MCG/ML BUPIVACAINE 1/10 % EPIDURAL INFUSION (WH - ANES)
14.0000 mL/h | INTRAMUSCULAR | Status: DC
  Administered 2012-08-21: 14 mL/h via EPIDURAL
  Filled 2012-08-21: qty 123

## 2012-08-21 MED ORDER — ONDANSETRON HCL 4 MG/2ML IJ SOLN: 4.0000 mg | Freq: Four times a day (QID) | INTRAMUSCULAR | Status: DC | PRN

## 2012-08-21 MED ORDER — LANOLIN HYDROUS EX OINT: TOPICAL_OINTMENT | CUTANEOUS | Status: DC | PRN

## 2012-08-21 MED ORDER — IBUPROFEN 600 MG PO TABS: 600.0000 mg | ORAL_TABLET | Freq: Four times a day (QID) | ORAL | Status: DC | PRN

## 2012-08-21 MED ORDER — LIDOCAINE HCL (PF) 1 % IJ SOLN
INTRAMUSCULAR | Status: DC | PRN
  Administered 2012-08-21 (×2): 5 mL

## 2012-08-21 MED ORDER — DIBUCAINE 1 % RE OINT: 1.0000 "application " | TOPICAL_OINTMENT | RECTAL | Status: DC | PRN

## 2012-08-21 MED ORDER — PRENATAL MULTIVITAMIN CH
1.0000 | ORAL_TABLET | Freq: Every day | ORAL | Status: DC
  Administered 2012-08-22 – 2012-08-23 (×2): 1 via ORAL
  Filled 2012-08-21 (×2): qty 1

## 2012-08-21 MED ORDER — LACTATED RINGERS IV SOLN
INTRAVENOUS | Status: DC
  Administered 2012-08-21 (×2): via INTRAVENOUS

## 2012-08-21 MED ORDER — LIDOCAINE HCL (PF) 1 % IJ SOLN
30.0000 mL | INTRAMUSCULAR | Status: DC | PRN
  Filled 2012-08-21: qty 30

## 2012-08-21 MED ORDER — WITCH HAZEL-GLYCERIN EX PADS: 1.0000 "application " | MEDICATED_PAD | CUTANEOUS | Status: DC | PRN

## 2012-08-21 MED ORDER — DIPHENHYDRAMINE HCL 25 MG PO CAPS: 25.0000 mg | ORAL_CAPSULE | Freq: Four times a day (QID) | ORAL | Status: DC | PRN

## 2012-08-21 MED ORDER — OXYTOCIN BOLUS FROM INFUSION
500.0000 mL | Freq: Once | INTRAVENOUS | Status: AC
  Administered 2012-08-21: 500 mL via INTRAVENOUS
  Filled 2012-08-21: qty 500

## 2012-08-21 MED ORDER — SENNOSIDES-DOCUSATE SODIUM 8.6-50 MG PO TABS
2.0000 | ORAL_TABLET | Freq: Every day | ORAL | Status: DC
  Administered 2012-08-21 – 2012-08-22 (×2): 2 via ORAL

## 2012-08-21 MED ORDER — SIMETHICONE 80 MG PO CHEW: 80.0000 mg | CHEWABLE_TABLET | ORAL | Status: DC | PRN

## 2012-08-21 MED ORDER — MISOPROSTOL 200 MCG PO TABS: 1000.0000 ug | ORAL_TABLET | Freq: Once | ORAL | Status: DC

## 2012-08-21 MED ORDER — PHENYLEPHRINE 40 MCG/ML (10ML) SYRINGE FOR IV PUSH (FOR BLOOD PRESSURE SUPPORT): 80.0000 ug | PREFILLED_SYRINGE | INTRAVENOUS | Status: DC | PRN

## 2012-08-21 MED ORDER — OXYTOCIN 40 UNITS IN LACTATED RINGERS INFUSION - SIMPLE MED
62.5000 mL/h | Freq: Once | INTRAVENOUS | Status: AC
  Administered 2012-08-21: 62.5 mL/h via INTRAVENOUS
  Filled 2012-08-21: qty 1000

## 2012-08-21 MED ORDER — DIPHENHYDRAMINE HCL 50 MG/ML IJ SOLN: 12.5000 mg | INTRAMUSCULAR | Status: DC | PRN

## 2012-08-21 MED ORDER — BUTORPHANOL TARTRATE 1 MG/ML IJ SOLN
1.0000 mg | Freq: Once | INTRAMUSCULAR | Status: AC
  Administered 2012-08-21: 1 mg via INTRAVENOUS
  Filled 2012-08-21 (×2): qty 1

## 2012-08-21 MED ORDER — IBUPROFEN 600 MG PO TABS
600.0000 mg | ORAL_TABLET | Freq: Four times a day (QID) | ORAL | Status: DC
  Administered 2012-08-21 – 2012-08-23 (×8): 600 mg via ORAL
  Filled 2012-08-21 (×6): qty 1

## 2012-08-21 MED ORDER — CITRIC ACID-SODIUM CITRATE 334-500 MG/5ML PO SOLN: 30.0000 mL | ORAL | Status: DC | PRN

## 2012-08-21 MED ORDER — ONDANSETRON HCL 4 MG/2ML IJ SOLN: 4.0000 mg | INTRAMUSCULAR | Status: DC | PRN

## 2012-08-21 MED ORDER — INFLUENZA VIRUS VACC SPLIT PF IM SUSP
0.5000 mL | Freq: Once | INTRAMUSCULAR | Status: AC
  Administered 2012-08-21: 0.5 mL via INTRAMUSCULAR

## 2012-08-21 MED ORDER — MISOPROSTOL 200 MCG PO TABS
ORAL_TABLET | ORAL | Status: AC
  Administered 2012-08-21: 1000 ug
  Filled 2012-08-21: qty 5

## 2012-08-21 MED ORDER — ONDANSETRON HCL 4 MG PO TABS: 4.0000 mg | ORAL_TABLET | ORAL | Status: DC | PRN

## 2012-08-21 MED ORDER — BENZOCAINE-MENTHOL 20-0.5 % EX AERO
1.0000 "application " | INHALATION_SPRAY | CUTANEOUS | Status: DC | PRN
  Administered 2012-08-23: 1 via TOPICAL
  Filled 2012-08-21: qty 56

## 2012-08-21 MED ORDER — PHENYLEPHRINE 40 MCG/ML (10ML) SYRINGE FOR IV PUSH (FOR BLOOD PRESSURE SUPPORT)
80.0000 ug | PREFILLED_SYRINGE | INTRAVENOUS | Status: DC | PRN
  Filled 2012-08-21: qty 5

## 2012-08-21 NOTE — H&P (Signed)
[name] [name] is a 41 y.o. female presenting for uc, denies srom or vag bleeding, with +FM, plans VBAC hx of VBAC.  AMA HX C/S HX VBAC Hx abn pap with colpo 02/2012 History OB History    Grav Para Term Preterm Abortions TAB SAB Ect Mult Living   7 3 2  0 3 1 2  0 0 2    2008 39 week 6#8 female C/S [facility] 7/12 40 3/7 week 7#10 female VBAC 12 hours Past Medical History  Diagnosis Date  . Yeast infection   . Bacterial infection   . HPV in female   . Abnormal Pap smear 1991    LEEP  . Infertility, female   . Condylomata acuminata in female   . H/O rubella   . H/O varicella   . Hx: UTI (urinary tract infection)     occasionally  . AMA (advanced maternal age) multigravida 35+   . LGSIL (low grade squamous intraepithelial dysplasia)    Past Surgical History  Procedure Date  . Dilation and curettage of uterus   . Cesarean section   . Wisdom tooth extraction     x 4  . Leep    Family History: family history includes Alcohol abuse in her father and mother; COPD in her paternal grandfather; Diabetes in her father; Heart disease in her father; Hypertension in her father; Mental illness in her mother; and Stroke in her paternal grandmother. Social History:  reports that she has never smoked. She has never used smokeless tobacco. She reports that she does not drink alcohol or use illicit drugs.   Prenatal Transfer Tool  Maternal Diabetes: No neg result Genetic Screening: Normal Maternal Ultrasounds/Referrals: Normal Fetal Ultrasounds or other Referrals:  None Maternal Substance Abuse:  No Significant Maternal Medications:  None Significant Maternal Lab Results:  Lab values include: Group B Strep negative Other Comments:  None Results for orders placed during the hospital encounter of 08/21/12 (from the past 24 hour(s))  CBC     Status: Abnormal   Collection Time   08/21/12 12:50 PM      Component Value Range   WBC 12.0 (*) 4.0 - 10.5 K/uL   RBC 4.13  3.87 - 5.11 MIL/uL   Hemoglobin  12.3  12.0 - 15.0 g/dL   HCT 78.2  95.6 - 21.3 %   MCV 90.3  78.0 - 100.0 fL   MCH 29.8  26.0 - 34.0 pg   MCHC 33.0  30.0 - 36.0 g/dL   RDW 08.6  57.8 - 46.9 %   Platelets 256  150 - 400 K/uL   BP 106/72  Pulse 113  Temp 98.3 F (36.8 C) (Oral)  Resp 20  Ht 5\' 6"  (1.676 m)  Wt 198 lb (89.812 kg)  BMI 31.96 kg/m2  LMP 11/25/2011  ROS  Dilation: 5 Effacement (%): 100 Station: -2 Exam by:: M. Pao Haffey, CNM Height 5\' 6"  (1.676 m), weight 198 lb (89.812 kg), last menstrual period 11/25/2011. Exam Physical Exam  Calm, no distress, cooperative, HEENT WNL grossly,  lungs clear bilaterally, AP RRR,  abd soft nt,no masses, not tympanic bowel sounds active,  no edema to lower extremities  Prenatal labs: ABO, Rh:   A positivve Antibody:  negative Rubella:  Immune RPR: NON REAC (07/19 1300)  HBsAg:   negative HIV:   negative GBS: NEGATIVE (09/10 1632)   Assessment/Plan: [redacted]w[redacted]d VBAC AMA Active labor Routine admission, plans epidural, vbac, collaboration with Dr. Pennie Rushing at 1300. Kimbra Marcelino 08/21/2012, 12:47 PM

## 2012-08-21 NOTE — Anesthesia Procedure Notes (Signed)
Epidural Patient location during procedure: OB Start time: 08/21/2012 1:29 PM  Staffing Anesthesiologist: Brayton Caves R Performed by: anesthesiologist   Preanesthetic Checklist Completed: patient identified, site marked, surgical consent, pre-op evaluation, timeout performed, IV checked, risks and benefits discussed and monitors and equipment checked  Epidural Patient position: sitting Prep: site prepped and draped and DuraPrep Patient monitoring: continuous pulse ox and blood pressure Approach: midline Injection technique: LOR air and LOR saline  Needle:  Needle type: Tuohy  Needle gauge: 17 G Needle length: 9 cm and 9 Needle insertion depth: 5 cm cm Catheter type: closed end flexible Catheter size: 19 Gauge Catheter at skin depth: 10 cm Test dose: negative  Assessment Events: blood not aspirated, injection not painful, no injection resistance, negative IV test and no paresthesia  Additional Notes Patient identified.  Risk benefits discussed including failed block, incomplete pain control, headache, nerve damage, paralysis, blood pressure changes, nausea, vomiting, reactions to medication both toxic or allergic, and postpartum back pain.  Patient expressed understanding and wished to proceed.  All questions were answered.  Sterile technique used throughout procedure and epidural site dressed with sterile barrier dressing. No paresthesia or other complications noted.The patient did not experience any signs of intravascular injection such as tinnitus or metallic taste in mouth nor signs of intrathecal spread such as rapid motor block. Please see nursing notes for vital signs.

## 2012-08-21 NOTE — Progress Notes (Signed)
Called to evaluate foley just in fhts to 90 slow recovery with L then R then knee chest, VSS, abd soft between uc, O2 10 L, Dr. Pennie Rushing updated on pt status per telephone. Lavera Guise, CNM

## 2012-08-21 NOTE — Progress Notes (Signed)
Comfortable, some pressure O BP 110/75  Pulse 103  Temp 98.3 F (36.8 C) (Oral)  Resp 20  Ht 5\' 6"  (1.676 m)  Wt 198 lb (89.812 kg)  BMI 31.96 kg/m2  LMP 11/25/2011      fhts category 1      abd soft between uc      Contractions q 2 mod      Vag 5 100 -2 VTX bulging, trickle clear to pale green did not rupture as husband is not here yet A SROM     VBAC P continue care Lavera Guise, CNM

## 2012-08-21 NOTE — Progress Notes (Signed)
Called to assess for increased bleeding with fundal checks, FF sm rubra flow, VSS, cytotec 1000 mcg rectally. Encouraged frequent voids. Lavera Guise, CNM

## 2012-08-21 NOTE — Anesthesia Preprocedure Evaluation (Signed)
Anesthesia Evaluation  Patient identified by MRN, date of birth, ID band Patient awake    Reviewed: Allergy & Precautions, H&P , Patient's Chart, lab work & pertinent test results  Airway Mallampati: II TM Distance: >3 FB Neck ROM: full    Dental No notable dental hx.    Pulmonary neg pulmonary ROS,  breath sounds clear to auscultation  Pulmonary exam normal       Cardiovascular negative cardio ROS  Rhythm:regular Rate:Normal     Neuro/Psych negative neurological ROS  negative psych ROS   GI/Hepatic negative GI ROS, Neg liver ROS,   Endo/Other  negative endocrine ROS  Renal/GU negative Renal ROS     Musculoskeletal   Abdominal   Peds  Hematology negative hematology ROS (+)   Anesthesia Other Findings Yeast infection     Bacterial infection        HPV in female     Abnormal Pap smear 1991 LEEP    Infertility, female     Condylomata acuminata in female        H/O rubella     H/O varicella        Hx: UTI (urinary tract infection)   occasionally AMA (advanced maternal age) multigravida 35+        LGSIL (low grade squamous intraepithelial dysplasia)    Reproductive/Obstetrics (+) Pregnancy                           Anesthesia Physical Anesthesia Plan  ASA: II  Anesthesia Plan: Epidural   Post-op Pain Management:    Induction:   Airway Management Planned:   Additional Equipment:   Intra-op Plan:   Post-operative Plan:   Informed Consent: I have reviewed the patients History and Physical, chart, labs and discussed the procedure including the risks, benefits and alternatives for the proposed anesthesia with the patient or authorized representative who has indicated his/her understanding and acceptance.     Plan Discussed with:   Anesthesia Plan Comments:         Anesthesia Quick Evaluation

## 2012-08-22 ENCOUNTER — Encounter: Payer: Medicaid Other | Admitting: Obstetrics and Gynecology

## 2012-08-22 LAB — CBC
Platelets: 178 10*3/uL (ref 150–400)
RBC: 3.44 MIL/uL — ABNORMAL LOW (ref 3.87–5.11)
RDW: 13.3 % (ref 11.5–15.5)
WBC: 13.6 10*3/uL — ABNORMAL HIGH (ref 4.0–10.5)

## 2012-08-22 MED ORDER — FERROUS SULFATE 325 (65 FE) MG PO TABS
325.0000 mg | ORAL_TABLET | Freq: Two times a day (BID) | ORAL | Status: DC
  Administered 2012-08-22 – 2012-08-23 (×2): 325 mg via ORAL
  Filled 2012-08-22 (×2): qty 1

## 2012-08-22 MED ORDER — DOCUSATE SODIUM 100 MG PO CAPS
100.0000 mg | ORAL_CAPSULE | Freq: Two times a day (BID) | ORAL | Status: DC
  Administered 2012-08-22 – 2012-08-23 (×2): 100 mg via ORAL
  Filled 2012-08-22 (×2): qty 1

## 2012-08-22 NOTE — Progress Notes (Addendum)
Post Partum Day 1:S/P: NSVD Subjective: no complaints Feeding: Breastfeeding Contraceptive plan: undecided - to discuss later today.  Objective: Blood pressure 122/77, pulse 116, temperature 97.5 F (36.4 C), temperature source Oral, resp. rate 18, height 5\' 6"  (1.676 m), weight 198 lb (89.812 kg), last menstrual period 11/25/2011, SpO2 98.00%, unknown if currently breastfeeding.  Physical Exam:  General: alert and cooperative Lochia: appropriate Uterine Fundus: firm IDVT Evaluation: No evidence of DVT seen on physical exam. Negative Homan's sign. No significant calf/ankle edema.   Basename 08/22/12 0455 08/21/12 1250  HGB 10.5* 12.3  HCT 31.6* 37.3   Mild postpartum anemia. Ferrous Sulfate  325 mg po BID commenced with Stool softener Colace 100 mgs po BID. Advised iron Rich Diet.  Assessment/Plan: Plan for discharge tomorrow   LOS: 1 day   [name] [name] 08/22/2012, 10:00 AM

## 2012-08-22 NOTE — Anesthesia Postprocedure Evaluation (Signed)
  Anesthesia Post-op Note  Patient: [name] [name]  Procedure(s) Performed: * No procedures listed *  Patient Location: [facility]  Anesthesia Type: Epidural  Level of Consciousness: awake, alert  and oriented  Airway and Oxygen Therapy: Patient Spontanous Breathing  Post-op Pain: mild  Post-op Assessment: Patient's Cardiovascular Status Stable, Respiratory Function Stable, No headache, No backache, No residual numbness and No residual motor weakness  Post-op Vital Signs: stable  Complications: No apparent anesthesia complications

## 2012-08-22 NOTE — Progress Notes (Signed)
I visited with patient while making rounds on the unit.  [name] [name] and her family were in good spirits during our visit.  They were not expecting that their baby would be in the NICU and it came as a shock to them yesterday.  Today they report feeling much better especially now that their son is doing better.  They are supporting each other well.  They are aware of on-going chaplain availability and we will continue to check in with them when we see them in the NICU.  Please also page as needs arise, [phone].  [name] [name] 12:40   08/22/12 1300  Clinical Encounter Type  Visited With Patient  Visit Type Initial

## 2012-08-22 NOTE — Progress Notes (Signed)
UR Chart review completed.  

## 2012-08-22 NOTE — Clinical Social Work Psychosocial (Signed)
Clinical Social Work Department BRIEF PSYCHOSOCIAL ASSESSMENT 08/22/2012  Patient:  [name],[name]     Account Number:  [id number]     Admit date:  08/21/2012  Clinical Social Worker:  Laketa Sandoz, LCSW  Date/Time:  08/22/2012 02:30 PM  Referred by:  RN  Date Referred:  08/22/2012 Referred for  Other - See comment   Other Referral:   NICU support   Interview type:  Patient Other interview type:   Patient had a friend visiting with her.    PSYCHOSOCIAL DATA Living Status:  FAMILY Admitted from facility:   Level of care:   Primary support name:  Dirk Querter Primary support relationship to patient:  SPOUSE Degree of support available:   Good support system    CURRENT CONCERNS Current Concerns  None Noted   Other Concerns:    SOCIAL WORK ASSESSMENT / PLAN SW met with MOB in her third floor room to introduce myself, complete assessment and evaluate how she is coping with baby's admission to NICU.  SW explained support services offered by NICU SW and inquired about any experience with PPD after other births.  SW identifies no need for further intervention at this time and has no social concerns at this time.   Assessment/plan status:  Psychosocial Support/Ongoing Assessment of Needs Other assessment/ plan:   Information/referral to community resources:   None identified    PATIENT'S/FAMILY'S RESPONSE TO PLAN OF CARE: MOB was pleasant, but did not seem interested in speaking with SW.  She and her visitor continued with side conversation and laughter.  SW thinks that MOB is fairly laid back about the situation and denies any issues with PPD after her other two children.  She reports good supports other than issues with her mother.  She did not want to elaborate on this comment.  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  She reports no questions, concerns or needs at  this time.  Pediatric follow up will be with Dr. Quinlan.          Clinical Social Work Department BRIEF PSYCHOSOCIAL ASSESSMENT 08/22/2012  Patient:  [name] [name], [name] [name]     Account Number:  [id number]     Admit date:  08/21/2012  Clinical Social Worker:  Almeta Monas  Date/Time:  08/22/2012 02:30 PM  Referred by:  RN  Date Referred:  08/22/2012 Referred for  Other - See comment   Other Referral:   NICU support   Interview type:  Patient Other interview type:   Patient had a friend visiting with her.    PSYCHOSOCIAL DATA Living Status:  FAMILY Admitted from facility:   Level of care:   Primary support name:  Lorenso Quarry Primary support relationship to patient:  SPOUSE Degree of support available:   Good support system    CURRENT CONCERNS Current Concerns  None Noted   Other Concerns:    SOCIAL WORK ASSESSMENT / PLAN SW met with MOB in her third floor room to introduce myself, complete assessment and evaluate how she is coping with baby's admission to NICU.  SW explained support services offered by NICU SW and inquired about any experience with PPD after other births.  SW identifies no need for further intervention at this time and has no social concerns at this time.   Assessment/plan status:  Psychosocial Support/Ongoing Assessment of Needs Other assessment/ plan:   Information/referral to community resources:   None identified    PATIENT'S/FAMILY'S RESPONSE TO PLAN OF CARE: MOB was pleasant, but did not seem interested in speaking with SW.  She and her visitor continued with side conversation and laughter.  SW thinks that MOB is fairly laid back about the situation and denies any issues with PPD after her other two children.  She reports good supports other than issues with her mother.  She did not want to elaborate on this comment.  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  She reports no questions, concerns or  needs at this time.  Pediatric follow up will be with Dr. Nash Dimmer.

## 2012-08-23 ENCOUNTER — Encounter (HOSPITAL_COMMUNITY)
Admission: RE | Admit: 2012-08-23 | Discharge: 2012-08-23 | Disposition: A | Discharge status: Home / Self Care | Payer: Medicaid Other | Source: Ambulatory Visit | Attending: Obstetrics and Gynecology | Admitting: Obstetrics and Gynecology

## 2012-08-23 DIAGNOSIS — O923 Agalactia: Secondary | ICD-10-CM | POA: Insufficient documentation

## 2012-08-23 MED ORDER — FERROUS SULFATE 325 (65 FE) MG PO TABS: 325.0000 mg | ORAL_TABLET | Freq: Every day | ORAL | Status: DC

## 2012-08-23 MED ORDER — IBUPROFEN 600 MG PO TABS: 600.0000 mg | ORAL_TABLET | Freq: Four times a day (QID) | ORAL | Status: DC | PRN

## 2012-08-23 NOTE — Progress Notes (Signed)
Pt thought that she was going to room-in in NICU with baby tonight, but clarified with NICU that that was not the plan - baby d/c plan undecided at this time. Notified Teressa Lower, CNM. Pt given d/c instructions w/teachback. Pt D/C'd home.

## 2012-08-23 NOTE — Discharge Summary (Signed)
Physician Discharge Summary  Patient ID: [name] [name] MRN: [id number] DOB/AGE: 02-01-1971 41 y.o.  Admit date: 08/21/2012 Discharge date: 08/23/2012  Admission Diagnoses: Early labor and desires VBAC. Evaluation of contraction and evaluation for labor progression  Discharge Diagnoses:  Active Problems:  SVD (spontaneous vaginal delivery)   Discharged Condition: stable  Hospital Course: Admitted in early labor, SROM light meconium after admission. Admitted to [facility]. Progressed quickly to 5 cms. Epidural anesthesia for labor pain management. NSVD of viable female infant.  Mother had 2nd degree Perineal laceration  with 3/0 Monocryl. Baby had poor transition and  spontaneous  Pneumonia thorax. Baby went to NICU for further management.  Lactating Mother. Mother was discharge on D 2 but remained in the hospital for care of the baby.  Consults: None  Significant Diagnostic Studies: routine labs normal and Anatomy Ultrasound normal  Treatments: IV hydration and analgesia: Epidural in labor and postpartum: Ibuprofen 600 mg po Q6hrly  Discharge Exam: Blood pressure 92/55, pulse 85, temperature 98.3 F (36.8 C), temperature source Oral, resp. rate 18, height 5\' 6"  (1.676 m), weight 198 lb (89.812 kg), last menstrual period 11/25/2011, SpO2 97.00%, unknown if currently breastfeeding. General appearance: alert, cooperative and no distress. Affect: AAO x 3  Lungs: CTAB CV: RRR GI: Neg Abdomen: soft, B/S x 4, Fundus -2/u,  GU: Normal voiding with no complications, Lochia: rubra, minimal  Extremities: Normal      Disposition: 01-Home or Self Care  Discharge Orders    Future Orders Please Complete By Expires   OB RESULTS CONSOLE RPR      Comments:   This external order was created through the Results Console.   OB RESULTS CONSOLE HIV antibody      Comments:   This external order was created through the Results Console.   OB RESULTS CONSOLE Rubella Antibody      Comments:   This external order was created through the Results Console.   OB RESULTS CONSOLE Hepatitis B surface antigen      Comments:   This external order was created through the Results Console.   OB RESULTS CONSOLE ABO/Rh      Comments:   This external order was created through the Results Console.   OB RESULTS CONSOLE Antibody Screen      Comments:   This external order was created through the Results Console.       Medication List     As of 08/23/2012  3:06 PM    ASK your doctor about these medications         fish oil-omega-3 fatty acids 1000 MG capsule   Take 1 g by mouth daily.      prenatal multivitamin Tabs   Take 1 tablet by mouth daily.           Follow-up Information    Follow up with [facility]. In 6 weeks.   Contact information:   [address] [place] [address] [phone]       NSVD viable female with MK. Baby remained in NICU as had Pneumatothorax s/p delivery. Mother has not choosen a name as yet at time of discharge. Care as per [facility] handbook.  Signed: Earl Gala, CNM. 08/23/2012, 3:06 PM

## 2012-11-10 ENCOUNTER — Other Ambulatory Visit: Payer: Self-pay

## 2014-09-20 ENCOUNTER — Encounter (HOSPITAL_COMMUNITY): Payer: Self-pay | Admitting: *Deleted

## 2016-02-10 ENCOUNTER — Other Ambulatory Visit: Payer: Self-pay

## 2016-02-10 DIAGNOSIS — Z1231 Encounter for screening mammogram for malignant neoplasm of breast: Secondary | ICD-10-CM

## 2017-01-23 ENCOUNTER — Encounter: Payer: Self-pay | Admitting: Women's Health

## 2017-01-23 ENCOUNTER — Ambulatory Visit (INDEPENDENT_AMBULATORY_CARE_PROVIDER_SITE_OTHER): Payer: BC Managed Care – PPO | Admitting: Women's Health

## 2017-01-23 VITALS — BP 128/86 | Ht 65.5 in | Wt 191.0 lb

## 2017-01-23 DIAGNOSIS — Z30014 Encounter for initial prescription of intrauterine contraceptive device: Secondary | ICD-10-CM

## 2017-01-23 NOTE — Progress Notes (Signed)
Presents to discuss IUD, and stress incontinence. Having monthly cycles. Up-to-date on annual exams. Has 558, 284, 48-year-old sons all doing well. Last 2 delivered vaginally and has noticed increased incontinence after last 2 delivered. Denies nocturia. No pain or burning, vaginal discharge, bleeding between cycles. Same partner. Reports normal Pap history.  Kindergarten Runner, broadcasting/film/videoteacher.  Exam: Appears well. External genitalia within normal limits, +1 cystocele, no leakage of urine with bearing down, able to tighten vaginal muscles. Bimanual uterus small nontender no adnexal fullness or tenderness.  Contraception management Stress incontinence  Plan: Contraception options reviewed Mirena IUD information reviewed slight risk for infection, perforation, hemorrhage. Will check insurance coverage have Dr. Audie BoxFontaine place with next menstrual cycle. Reviewed effectiveness, good for 5 years. Encouraged to continue exercise, abdominal weight loss often helpful for stress incontinence, emptying bladder more frequently encouraged.

## 2017-01-23 NOTE — Patient Instructions (Signed)
Breast center  [phone] Levonorgestrel intrauterine device (IUD) What is this medicine? LEVONORGESTREL IUD (LEE voe nor jes trel) is a contraceptive (birth control) device. The device is placed inside the uterus by a healthcare professional. It is used to prevent pregnancy. This device can also be used to treat heavy bleeding that occurs during your period. This medicine may be used for other purposes; ask your health care provider or pharmacist if you have questions. COMMON BRAND NAME(S): Cameron Ali What should I tell my health care provider before I take this medicine? They need to know if you have any of these conditions: -abnormal Pap smear -cancer of the breast, uterus, or cervix -diabetes -endometritis -genital or pelvic infection now or in the past -have more than one sexual partner or your partner has more than one partner -heart disease -history of an ectopic or tubal pregnancy -immune system problems -IUD in place -liver disease or tumor -problems with blood clots or take blood-thinners -seizures -use intravenous drugs -uterus of unusual shape -vaginal bleeding that has not been explained -an unusual or allergic reaction to levonorgestrel, other hormones, silicone, or polyethylene, medicines, foods, dyes, or preservatives -pregnant or trying to get pregnant -breast-feeding How should I use this medicine? This device is placed inside the uterus by a health care professional. Talk to your pediatrician regarding the use of this medicine in children. Special care may be needed. Overdosage: If you think you have taken too much of this medicine contact a poison control center or emergency room at once. NOTE: This medicine is only for you. Do not share this medicine with others. What if I miss a dose? This does not apply. Depending on the brand of device you have inserted, the device will need to be replaced every 3 to 5 years if you wish to continue using  this type of birth control. What may interact with this medicine? Do not take this medicine with any of the following medications: -amprenavir -bosentan -fosamprenavir This medicine may also interact with the following medications: -aprepitant -armodafinil -barbiturate medicines for inducing sleep or treating seizures -bexarotene -boceprevir -griseofulvin -medicines to treat seizures like carbamazepine, ethotoin, felbamate, oxcarbazepine, phenytoin, topiramate -modafinil -pioglitazone -rifabutin -rifampin -rifapentine -some medicines to treat HIV infection like atazanavir, efavirenz, indinavir, lopinavir, nelfinavir, tipranavir, ritonavir -St. John's wort -warfarin This list may not describe all possible interactions. Give your health care provider a list of all the medicines, herbs, non-prescription drugs, or dietary supplements you use. Also tell them if you smoke, drink alcohol, or use illegal drugs. Some items may interact with your medicine. What should I watch for while using this medicine? Visit your doctor or health care professional for regular check ups. See your doctor if you or your partner has sexual contact with others, becomes HIV positive, or gets a sexual transmitted disease. This product does not protect you against HIV infection (AIDS) or other sexually transmitted diseases. You can check the placement of the IUD yourself by reaching up to the top of your vagina with clean fingers to feel the threads. Do not pull on the threads. It is a good habit to check placement after each menstrual period. Call your doctor right away if you feel more of the IUD than just the threads or if you cannot feel the threads at all. The IUD may come out by itself. You may become pregnant if the device comes out. If you notice that the IUD has come out use a backup birth control method like  condoms and call your health care provider. Using tampons will not change the position of the IUD and  are okay to use during your period. This IUD can be safely scanned with magnetic resonance imaging (MRI) only under specific conditions. Before you have an MRI, tell your healthcare provider that you have an IUD in place, and which type of IUD you have in place. What side effects may I notice from receiving this medicine? Side effects that you should report to your doctor or health care professional as soon as possible: -allergic reactions like skin rash, itching or hives, swelling of the face, lips, or tongue -fever, flu-like symptoms -genital sores -high blood pressure -no menstrual period for 6 weeks during use -pain, swelling, warmth in the leg -pelvic pain or tenderness -severe or sudden headache -signs of pregnancy -stomach cramping -sudden shortness of breath -trouble with balance, talking, or walking -unusual vaginal bleeding, discharge -yellowing of the eyes or skin Side effects that usually do not require medical attention (report to your doctor or health care professional if they continue or are bothersome): -acne -breast pain -change in sex drive or performance -changes in weight -cramping, dizziness, or faintness while the device is being inserted -headache -irregular menstrual bleeding within first 3 to 6 months of use -nausea This list may not describe all possible side effects. Call your doctor for medical advice about side effects. You may report side effects to FDA at 1-800-FDA-1088. Where should I keep my medicine? This does not apply. NOTE: This sheet is a summary. It may not cover all possible information. If you have questions about this medicine, talk to your doctor, pharmacist, or health care provider.  2018 Elsevier/Gold Standard (2016-08-17 14:14:56)

## 2017-01-28 ENCOUNTER — Ambulatory Visit (INDEPENDENT_AMBULATORY_CARE_PROVIDER_SITE_OTHER): Payer: BC Managed Care – PPO | Admitting: Gynecology

## 2017-01-28 ENCOUNTER — Encounter: Payer: Self-pay | Admitting: Gynecology

## 2017-01-28 VITALS — BP 124/82

## 2017-01-28 DIAGNOSIS — Z30430 Encounter for insertion of intrauterine contraceptive device: Secondary | ICD-10-CM

## 2017-01-28 HISTORY — PX: INTRAUTERINE DEVICE INSERTION: SHX323

## 2017-01-28 NOTE — Progress Notes (Signed)
    [name] BockStacy [name] Oct 03, 1971 [id number]        46 y.o.  [id number]  presents for Mirena IUD placement. She has read through the booklet, has no contraindications and signed the consent form. She currently is on a normal menses.  I reviewed the insertional process with her as well as the risks to include infection, either immediate or long-term, uterine perforation or migration requiring surgery to remove, other complications such as pain, hormonal side effects and possibility of failure with subsequent pregnancy.   Exam with [name] FritzBlanca assistant Vitals:   01/28/17 0848  BP: 124/82    Pelvic: External BUS vagina normal. Cervix normal with slight menses flow. Uterus anteverted normal size shape contour midline mobile nontender. Adnexa without masses or tenderness.  Procedure: The cervix was cleansed with Betadine, anterior lip grasped with a single-tooth tenaculum, the uterus was sounded and a Mirena IUD was placed according to manufacturer's recommendations without difficulty. The strings were trimmed. The patient tolerated well and will follow up in one month for a postinsertional check.  Lot number:  [id number] [id number]    [name] P MD, 9:07 AM 01/28/2017

## 2017-01-28 NOTE — Patient Instructions (Signed)

## 2017-02-20 ENCOUNTER — Ambulatory Visit: Payer: BC Managed Care – PPO | Admitting: Gynecology

## 2017-02-21 ENCOUNTER — Ambulatory Visit (INDEPENDENT_AMBULATORY_CARE_PROVIDER_SITE_OTHER): Payer: BC Managed Care – PPO | Admitting: Gynecology

## 2017-02-21 ENCOUNTER — Encounter: Payer: Self-pay | Admitting: Gynecology

## 2017-02-21 VITALS — BP 120/70

## 2017-02-21 DIAGNOSIS — Z30431 Encounter for routine checking of intrauterine contraceptive device: Secondary | ICD-10-CM | POA: Diagnosis not present

## 2017-02-21 NOTE — Progress Notes (Signed)
    [name] [name] 1971/02/05 [id number]        46 y.o.  [id number] presents for follow up exam having Mirena IUD placed 01/28/2017. Doing well with no issues with bleeding or pain.  Past medical history,surgical history, problem list, medications, allergies, family history and social history were all reviewed and documented in the EPIC chart.  Directed ROS with pertinent positives and negatives documented in the history of present illness/assessment and plan.  Exam: [name] [name] assistant Vitals:   02/21/17 1113  BP: 120/70   General appearance:  Normal Abdomen soft nontender without masses guarding rebound Pelvic external BUS vagina normal. Cervix normal. IUD string visualized and trimmed. Uterus normal size midline mobile nontender. Adnexa without masses or tenderness.  Assessment/Plan:  46 y.o. [id number] with normal follow up IUD check. Patient will follow up when due for her annual exam, sooner if any issues.    Dara Lords MD, 11:27 AM 02/21/2017

## 2017-02-21 NOTE — Patient Instructions (Signed)
Follow up when due for your annual exam. 

## 2017-09-18 ENCOUNTER — Other Ambulatory Visit: Payer: Self-pay | Admitting: Gynecology

## 2017-09-18 DIAGNOSIS — Z1231 Encounter for screening mammogram for malignant neoplasm of breast: Secondary | ICD-10-CM

## 2017-10-16 ENCOUNTER — Ambulatory Visit
Admission: RE | Admit: 2017-10-16 | Discharge: 2017-10-16 | Disposition: A | Discharge status: Home / Self Care | Payer: Self-pay | Source: Ambulatory Visit | Attending: Gynecology | Admitting: Gynecology

## 2017-10-16 ENCOUNTER — Encounter: Payer: Self-pay | Admitting: Radiology

## 2017-10-16 DIAGNOSIS — Z1231 Encounter for screening mammogram for malignant neoplasm of breast: Secondary | ICD-10-CM

## 2017-10-21 ENCOUNTER — Other Ambulatory Visit: Payer: Self-pay | Admitting: Gynecology

## 2017-10-21 DIAGNOSIS — R928 Other abnormal and inconclusive findings on diagnostic imaging of breast: Secondary | ICD-10-CM

## 2017-10-30 ENCOUNTER — Other Ambulatory Visit: Payer: BC Managed Care – PPO

## 2017-11-01 ENCOUNTER — Other Ambulatory Visit: Payer: BC Managed Care – PPO

## 2017-11-11 ENCOUNTER — Other Ambulatory Visit: Payer: BC Managed Care – PPO

## 2017-11-15 ENCOUNTER — Ambulatory Visit
Admission: RE | Admit: 2017-11-15 | Discharge: 2017-11-15 | Disposition: A | Discharge status: Home / Self Care | Payer: BC Managed Care – PPO | Source: Ambulatory Visit | Attending: Gynecology | Admitting: Gynecology

## 2017-11-15 DIAGNOSIS — R928 Other abnormal and inconclusive findings on diagnostic imaging of breast: Secondary | ICD-10-CM

## 2019-06-08 ENCOUNTER — Other Ambulatory Visit: Payer: Self-pay | Admitting: Gynecology

## 2019-06-08 DIAGNOSIS — Z1231 Encounter for screening mammogram for malignant neoplasm of breast: Secondary | ICD-10-CM

## 2019-07-23 ENCOUNTER — Other Ambulatory Visit: Payer: Self-pay

## 2019-07-23 ENCOUNTER — Ambulatory Visit
Admission: RE | Admit: 2019-07-23 | Discharge: 2019-07-23 | Disposition: A | Discharge status: Home / Self Care | Payer: BC Managed Care – PPO | Source: Ambulatory Visit | Attending: Gynecology | Admitting: Gynecology

## 2019-07-23 DIAGNOSIS — Z1231 Encounter for screening mammogram for malignant neoplasm of breast: Secondary | ICD-10-CM

## 2019-08-12 ENCOUNTER — Encounter: Payer: Self-pay | Admitting: Gynecology

## 2020-01-14 ENCOUNTER — Ambulatory Visit: Payer: BC Managed Care – PPO | Attending: Internal Medicine

## 2020-01-14 ENCOUNTER — Other Ambulatory Visit: Payer: Self-pay

## 2020-01-14 DIAGNOSIS — Z23 Encounter for immunization: Secondary | ICD-10-CM

## 2020-01-14 NOTE — Progress Notes (Signed)
   Covid-19 Vaccination Clinic  Name:  [name] [name]    MRN: [id number] DOB: 1971/07/18  01/14/2020  [name] was observed post Covid-19 immunization for 15 minutes without incidence. She was provided with Vaccine Information Sheet and instruction to access the V-Safe system.   [name] was instructed to call 911 with any severe reactions post vaccine: Marland Kitchen Difficulty breathing  . Swelling of your face and throat  . A fast heartbeat  . A bad rash all over your body  . Dizziness and weakness    Immunizations Administered    Name Date Dose VIS Date Route   Moderna COVID-19 Vaccine 01/14/2020  1:49 PM 0.5 mL 10/20/2019 Intramuscular   Manufacturer: Moderna   Lot: [id number]   NDC: 74142-395-32

## 2020-02-16 ENCOUNTER — Ambulatory Visit: Payer: BC Managed Care – PPO | Attending: Family

## 2020-02-16 ENCOUNTER — Ambulatory Visit: Payer: BC Managed Care – PPO

## 2020-02-16 DIAGNOSIS — Z23 Encounter for immunization: Secondary | ICD-10-CM

## 2020-02-16 NOTE — Progress Notes (Signed)
   Covid-19 Vaccination Clinic  Name:  [name] [name]    MRN: [id number] DOB: 06/16/1971  02/16/2020  [name] was observed post Covid-19 immunization for 15 minutes without incident. She was provided with Vaccine Information Sheet and instruction to access the V-Safe system.   [name] was instructed to call 911 with any severe reactions post vaccine: Marland Kitchen Difficulty breathing  . Swelling of face and throat  . A fast heartbeat  . A bad rash all over body  . Dizziness and weakness   Immunizations Administered    Name Date Dose VIS Date Route   Moderna COVID-19 Vaccine 02/16/2020 11:22 AM 0.5 mL 10/20/2019 Intramuscular   Manufacturer: Moderna   Lot: [id number]   NDC: 83291-916-60

## 2020-06-10 ENCOUNTER — Other Ambulatory Visit (HOSPITAL_COMMUNITY)
Admission: RE | Admit: 2020-06-10 | Discharge: 2020-06-10 | Disposition: A | Discharge status: Home / Self Care | Payer: BC Managed Care – PPO | Source: Ambulatory Visit | Attending: Family Medicine | Admitting: Family Medicine

## 2020-06-10 ENCOUNTER — Other Ambulatory Visit: Payer: Self-pay | Admitting: Family Medicine

## 2020-06-10 DIAGNOSIS — Z124 Encounter for screening for malignant neoplasm of cervix: Secondary | ICD-10-CM | POA: Diagnosis not present

## 2020-06-14 LAB — CYTOLOGY - PAP
Comment: NEGATIVE
Diagnosis: NEGATIVE
High risk HPV: NEGATIVE

## 2021-05-08 ENCOUNTER — Other Ambulatory Visit: Payer: Self-pay | Admitting: Family Medicine

## 2021-05-08 DIAGNOSIS — Z1231 Encounter for screening mammogram for malignant neoplasm of breast: Secondary | ICD-10-CM

## 2021-05-13 ENCOUNTER — Other Ambulatory Visit: Payer: Self-pay

## 2021-05-13 ENCOUNTER — Ambulatory Visit
Admission: RE | Admit: 2021-05-13 | Discharge: 2021-05-13 | Disposition: A | Discharge status: Home / Self Care | Payer: BC Managed Care – PPO | Source: Ambulatory Visit | Attending: Family Medicine | Admitting: Family Medicine

## 2021-05-13 DIAGNOSIS — Z1231 Encounter for screening mammogram for malignant neoplasm of breast: Secondary | ICD-10-CM

## 2021-09-13 ENCOUNTER — Other Ambulatory Visit: Payer: Self-pay

## 2021-09-13 ENCOUNTER — Ambulatory Visit: Payer: BC Managed Care – PPO | Attending: Family Medicine | Admitting: Physical Therapy

## 2021-09-13 DIAGNOSIS — R293 Abnormal posture: Secondary | ICD-10-CM | POA: Insufficient documentation

## 2021-09-13 DIAGNOSIS — N858 Other specified noninflammatory disorders of uterus: Secondary | ICD-10-CM | POA: Diagnosis not present

## 2021-09-13 DIAGNOSIS — M6208 Separation of muscle (nontraumatic), other site: Secondary | ICD-10-CM | POA: Diagnosis not present

## 2021-09-13 DIAGNOSIS — N393 Stress incontinence (female) (male): Secondary | ICD-10-CM | POA: Insufficient documentation

## 2021-09-13 DIAGNOSIS — M6281 Muscle weakness (generalized): Secondary | ICD-10-CM | POA: Insufficient documentation

## 2021-09-14 ENCOUNTER — Encounter: Payer: Self-pay | Admitting: Physical Therapy

## 2021-09-14 NOTE — Therapy (Addendum)
[facility] @ [facility] [place], [place], [address] Phone: [phone]   Fax:  [phone]  Physical Therapy Evaluation  Patient Details  Name: [name] [name] MRN: [id number] Date of Birth: Mar 23, 1971 Referring Provider (PT): Faustino Congress, NP   Encounter Date: 09/13/2021   PT End of Session - 09/14/21 1119     Visit Number 1    Date for PT Re-Evaluation 12/06/21    Authorization Type bcbs    PT Start Time 1533    PT Stop Time 1620    PT Time Calculation (min) 47 min    Activity Tolerance Patient tolerated treatment well    Behavior During Therapy [facility] for tasks assessed/performed             Past Medical History:  Diagnosis Date   Abnormal Pap smear 1991   LEEP   AMA (advanced maternal age) multigravida 35+    Bacterial infection    Condylomata acuminata in female    H/O rubella    H/O varicella    HPV in female    Hx: UTI (urinary tract infection)    occasionally   Infertility, female    LGSIL (low grade squamous intraepithelial dysplasia)    Yeast infection     Past Surgical History:  Procedure Laterality Date   CESAREAN SECTION     DILATION AND CURETTAGE OF UTERUS     INTRAUTERINE DEVICE INSERTION  01/28/2017   Mirena   LEEP     WISDOM TOOTH EXTRACTION     x 4    There were no vitals filed for this visit.    Subjective Assessment - 09/13/21 1552     Subjective Pt has tried kegels and nothing has worked.  I can't run or do jumping exercises.  If I cough a lot I will have more leakage.  Denies urgency. 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  A bad episode I will have to change my underwear like a coughing fit    Patient Stated Goals stop leaking and be more active    Currently in Pain? No/denies                [facility] PT Assessment - 09/14/21 0001       Assessment   Medical Diagnosis N39.3 (ICD-10-CM) - Stress incontinence (female)  (female)    Referring Provider (PT) Faustino Congress, NP      Precautions   Precautions None      Balance Screen   Has the patient fallen in the past 6 months No      [place] residence    Living Arrangements Spouse/significant other;Children      Prior Function   Level of Independence Independent    Vocation Full time employment    Vocation Requirements kindergarted teacher      Cognition   Overall Cognitive Status Within Functional Limits for tasks assessed      Posture/Postural Control   Posture/Postural Control Postural limitations    Postural Limitations Increased lumbar lordosis;Rounded Shoulders;Anterior pelvic tilt;Increased thoracic kyphosis      ROM / Strength   AROM / PROM / Strength Strength;PROM;AROM      AROM   Overall AROM Comments lumbar flexion 80%      PROM   Overall PROM Comments hip ROM grossly 75%      Strength   Overall Strength Comments 5/5 bil hip  Flexibility   Soft Tissue Assessment /Muscle Length yes    Hamstrings 75%      Palpation   Palpation comment tight lumbar and thoracic paraspinals, gluteals; tight c-section scar      Ambulation/Gait   Gait Pattern Within Functional Limits                        Objective measurements completed on examination: See above findings.     Pelvic Floor Special Questions - 09/14/21 0001     Prior Pregnancies Yes    Number of Pregnancies 3    Number of C-Sections 1    Number of Vaginal Deliveries 2    Diastasis Recti bulge and tenting superior to umbilicus    Currently Sexually Active Yes    Urinary Leakage Yes    Activities that cause leaking Coughing;Sneezing;Laughing    Urinary frequency hold it all day at work    Fecal incontinence No    Falling out feeling (prolapse) No    External Palpation c-section scar tension    Pelvic Floor Internal Exam pt identity confirmed and informed consent given to perform internal assessment    Exam  Type Vaginal    Palpation Lt side slightly less tension; holding some tension after doing kegel and kegel not getting max closure    Strength # of reps 5    Strength # of seconds 6    Tone on the high side of normal after 5 seconds of resting between sets              [facility] Adult PT Treatment/Exercise - 09/14/21 0001       Self-Care   Self-Care Other Self-Care Comments    Other Self-Care Comments  intial HEP educated and performed                     PT Education - 09/14/21 1141     Education Details Access Code: [email or web address]    Person(s) Educated Patient    Methods Explanation;Demonstration;Tactile cues;Verbal cues;Handout    Comprehension Verbalized understanding;Returned demonstration              PT Short Term Goals - 09/14/21 1126       PT SHORT TERM GOAL #1   Title ind with initial HEP    Time 4    Period Weeks    Status New    Target Date 10/11/21               PT Long Term Goals - 09/14/21 1123       PT LONG TERM GOAL #1   Title Pt will be able to cough or sneeze as needed without leakage    Time 12    Period Weeks    Status New    Target Date 12/06/21      PT LONG TERM GOAL #2   Title Pt will be able to sustain isolated kegel for at least15 seconds so she can have endurance needed for functional activities.    Time 12    Period Weeks    Status New    Target Date 12/06/21      PT LONG TERM GOAL #3   Title Pt will be ind with advanced HEP to return to normal exercise routine    Time 12    Period Weeks    Status New    Target Date 12/06/21      PT LONG TERM GOAL #4  Title Pt will be able to jog or jump lightly for at least 30 seconds in order to increase cardio activities without leakage    Time 12    Period Weeks    Status New    Target Date 12/06/21                    Plan - 09/14/21 1127     Clinical Impression Statement Pt presents to skilled PT due to stress incontinence that has been an issue since her  vaginal delivery.  Pt had c-section delivery prior to that followed by 2 vaginal deliveries.  Pt has tension in scar tissue at c-section incision. Pt has increased lumbar lordosis and rounded shoulder increased throracic kyphosis.  Pt has bulge in abdomen with diastasis of rectus abdominus superior to umbilicus.  Pt is tight in gluteals and low back and hamstring muscles.  Pt with pelvic assessment demonstrates some higher resting tone and difficulty relaxing.  strength is 3/5 and can hold for 6 seconds for at least 5 reps.  Pt will benefit from skilled PT to address strength deficits and impairments mentioned above in order to regain bladder control    Personal Factors and Comorbidities Comorbidity 2    Comorbidities c-section and 2 vaginal deliveries    Examination-Activity Limitations Continence    Examination-Participation Restrictions Community Activity    Stability/Clinical Decision Making Stable/Uncomplicated    Clinical Decision Making Low    Rehab Potential Excellent    PT Frequency 1x / week    PT Duration 12 weeks    PT Treatment/Interventions ADLs/Self Care Home Management;Biofeedback;Cryotherapy;Electrical Stimulation;Moist Heat;Therapeutic activities;Therapeutic exercise;Neuromuscular re-education;Manual techniques;Patient/family education;Taping;Dry needling;Passive range of motion    PT Next Visit Plan core strength and kegel in isolation and with lifting; exhale with exertion    PT Home Exercise Plan Access Code: [email or web address]    Consulted and Agree with Plan of Care Patient             Patient will benefit from skilled therapeutic intervention in order to improve the following deficits and impairments:  Postural dysfunction, Impaired flexibility, Increased fascial restricitons, Decreased strength, Decreased coordination, Decreased endurance  Visit Diagnosis: Muscle weakness (generalized)  Abnormal posture     Problem List Patient Active Problem List   Diagnosis Date  Noted   SVD (spontaneous vaginal delivery) 08/21/2012   Tick bite 06/24/2012   AMA (advanced maternal age) multigravida 35+ /APA 02/17/2012   H/O infertility 02/05/2012   Hx LEEP (loop electrosurgical excision procedure) 02/05/2012   History of New C-section with sub VBAC 02/05/2012    2   SAB (spontaneous abortion) 02/05/2012    Jule Ser, PT 09/14/2021, 12:35 PM  [facility] @ [facility] [address] [place], [place], [address] Phone: [phone]   Fax:  [phone]  Name: Zane Samson MRN: [id number] Date of Birth: Jan 12, 1971  PHYSICAL THERAPY DISCHARGE SUMMARY  Visits from Start of Care: 1  Current functional level related to goals / functional outcomes: Eval only   Remaining deficits: See above details   Education / Equipment: See chart - eval only   Patient agrees to discharge. Patient goals were not met. Patient is being discharged due to not returning since the last visit.  Gustavus Bryant, PT 12/19/21 7:54 AM

## 2021-09-14 NOTE — Patient Instructions (Signed)
Access Code: [email or web address] URL: [email or web address] Date: 09/14/2021 Prepared by: Dwana Curd  Exercises Sit to Stand with Pelvic Floor Contraction - 3 x daily - 7 x weekly - 1 sets - 10 reps Table Lean - 3 x daily - 7 x weekly - 1 sets - 10 reps

## 2021-12-17 IMAGING — MG DIGITAL SCREENING BILAT W/ CAD
4 series · 4 of 4 positions shown · non-contrast
Comparison: Previous exam(s).

ACR Breast Density Category a: The breast tissue is almost entirely
fatty.

CLINICAL DATA: Screening.

EXAM:
DIGITAL SCREENING BILATERAL MAMMOGRAM WITH CAD
TECHNIQUE: Bilateral screening digital craniocaudal and mediolateral oblique
mammograms were obtained. The images were evaluated with
computer-aided detection.

[R CC]
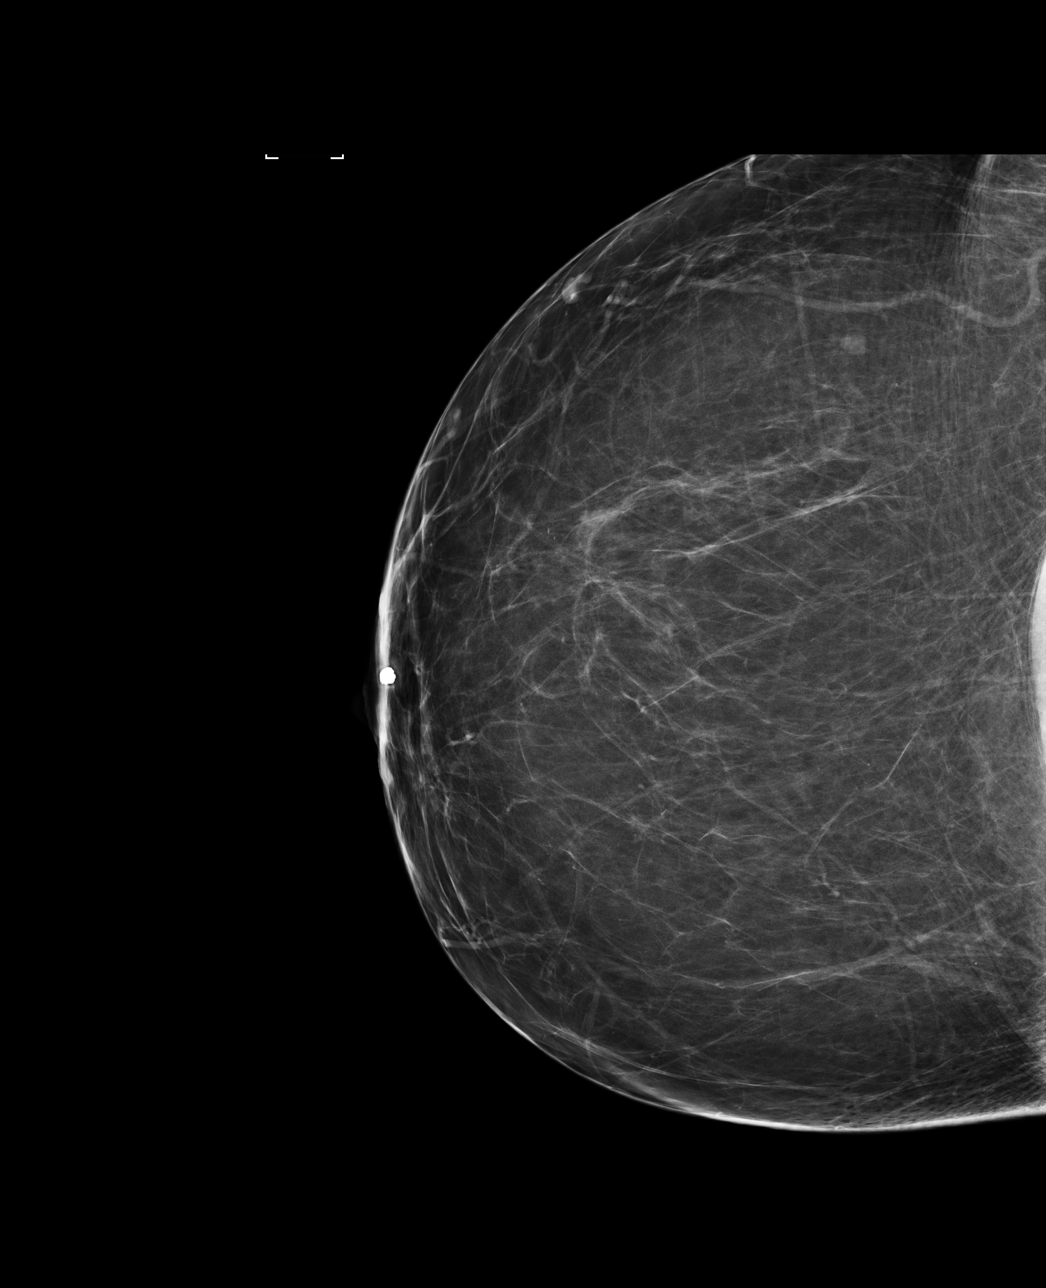

[L CC]
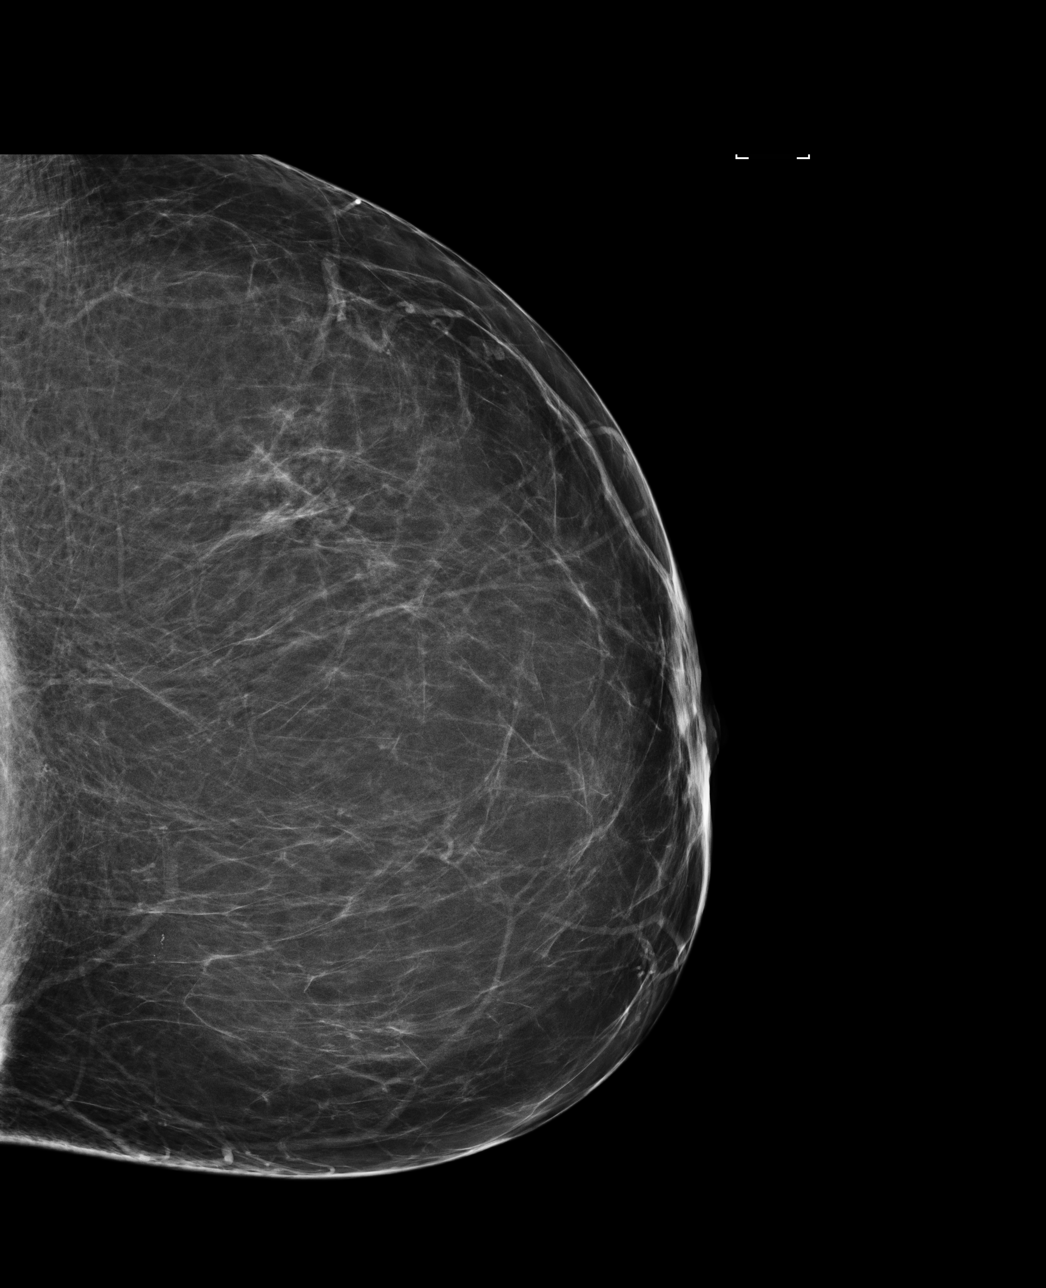

[R MLO]
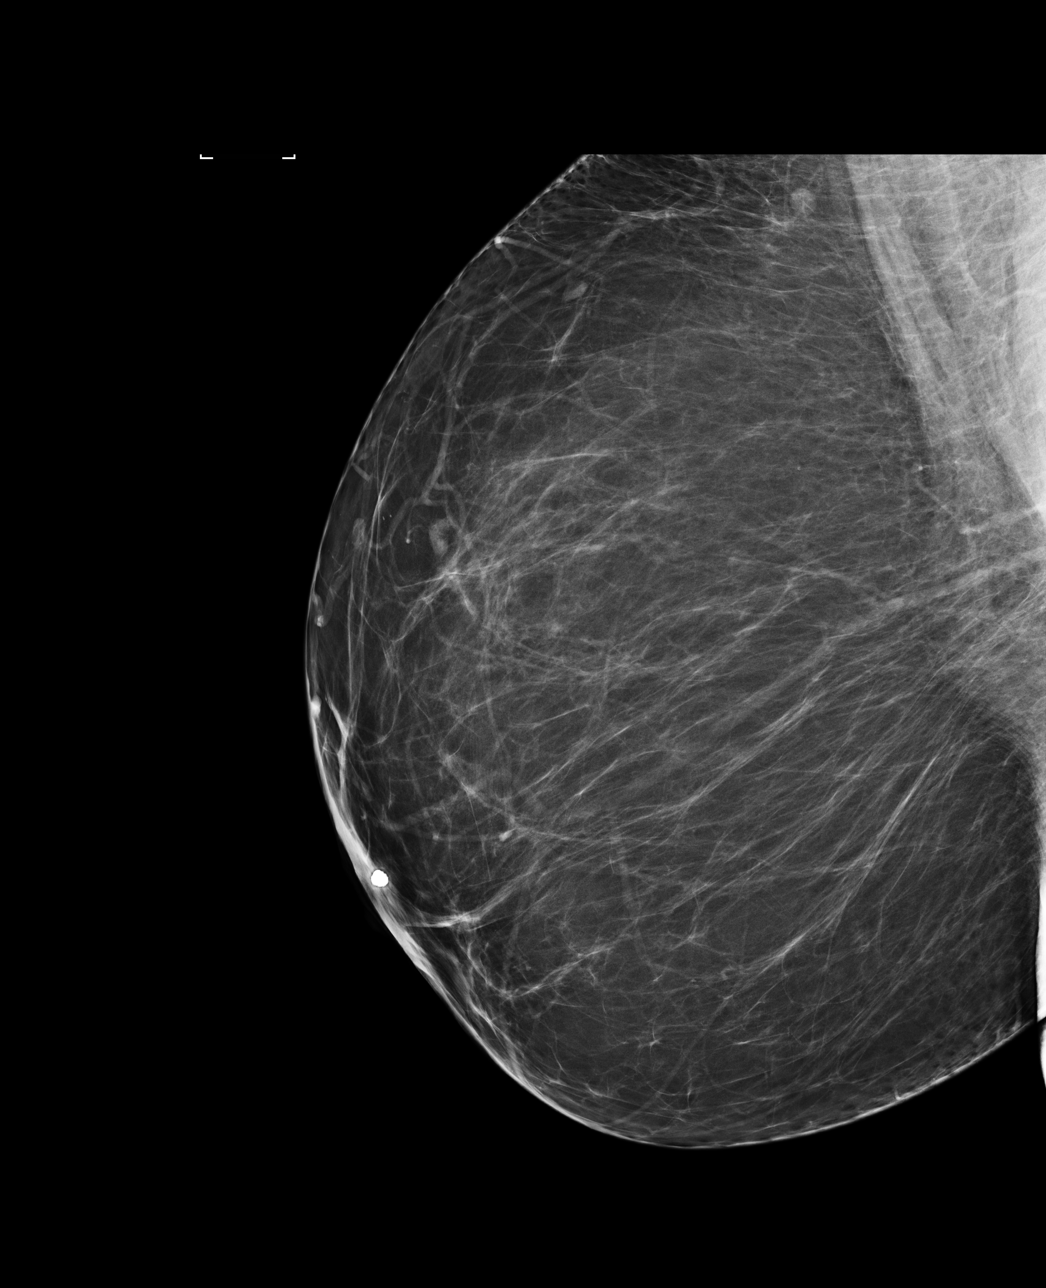

[L MLO]
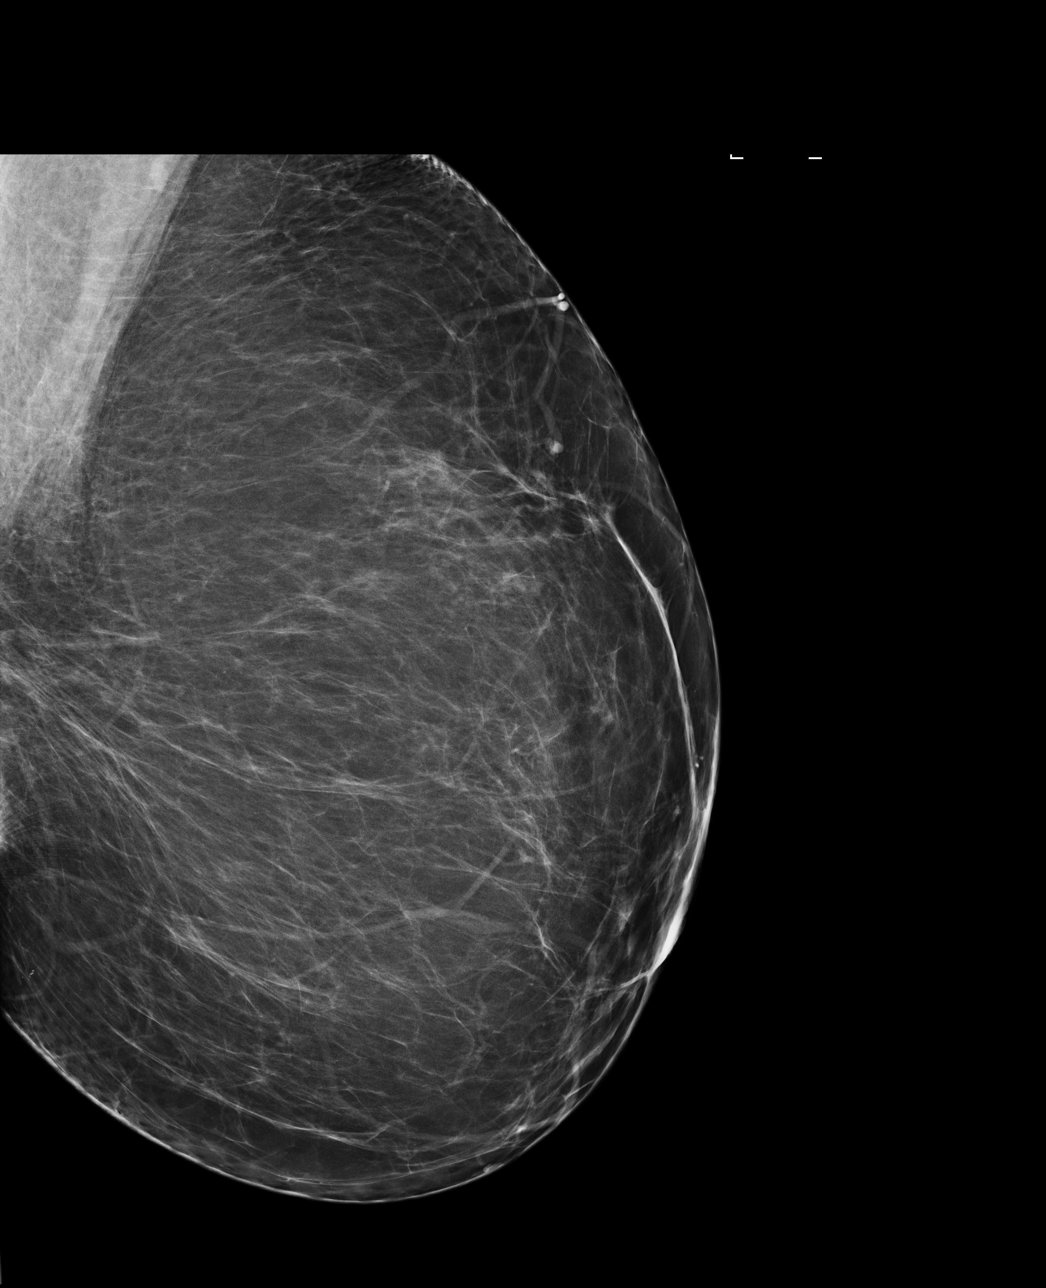

[4 of 4 positions shown; findings below may reference images not displayed]

FINDINGS: There are no findings suspicious for malignancy.
IMPRESSION: No mammographic evidence of malignancy. A result letter of this
screening mammogram will be mailed directly to the patient.

RECOMMENDATION:
Screening mammogram in one year. (Code:9G-A-2OA)

BI-RADS CATEGORY  1: Negative.

## 2022-03-29 DIAGNOSIS — N393 Stress incontinence (female) (male): Secondary | ICD-10-CM | POA: Insufficient documentation

## 2022-05-08 ENCOUNTER — Other Ambulatory Visit: Payer: Self-pay | Admitting: Family Medicine

## 2022-05-08 DIAGNOSIS — Z1231 Encounter for screening mammogram for malignant neoplasm of breast: Secondary | ICD-10-CM

## 2022-07-04 ENCOUNTER — Ambulatory Visit
Admission: RE | Admit: 2022-07-04 | Discharge: 2022-07-04 | Disposition: A | Discharge status: Home / Self Care | Payer: BC Managed Care – PPO | Source: Ambulatory Visit | Attending: Family Medicine | Admitting: Family Medicine

## 2022-07-04 DIAGNOSIS — Z1231 Encounter for screening mammogram for malignant neoplasm of breast: Secondary | ICD-10-CM

## 2023-06-19 ENCOUNTER — Other Ambulatory Visit: Payer: Self-pay | Admitting: Family Medicine

## 2023-06-19 DIAGNOSIS — Z1231 Encounter for screening mammogram for malignant neoplasm of breast: Secondary | ICD-10-CM

## 2023-07-09 ENCOUNTER — Ambulatory Visit: Payer: BC Managed Care – PPO

## 2023-10-16 ENCOUNTER — Ambulatory Visit: Payer: BC Managed Care – PPO

## 2023-10-30 ENCOUNTER — Emergency Department (HOSPITAL_BASED_OUTPATIENT_CLINIC_OR_DEPARTMENT_OTHER): Payer: BC Managed Care – PPO

## 2023-10-30 ENCOUNTER — Emergency Department (HOSPITAL_BASED_OUTPATIENT_CLINIC_OR_DEPARTMENT_OTHER)
Admission: EM | Admit: 2023-10-30 | Discharge: 2023-10-30 | Disposition: A | Discharge status: Home / Self Care | Payer: BC Managed Care – PPO | Attending: Emergency Medicine | Admitting: Emergency Medicine

## 2023-10-30 ENCOUNTER — Other Ambulatory Visit: Payer: Self-pay

## 2023-10-30 DIAGNOSIS — S61002A Unspecified open wound of left thumb without damage to nail, initial encounter: Secondary | ICD-10-CM | POA: Insufficient documentation

## 2023-10-30 DIAGNOSIS — W268XXA Contact with other sharp object(s), not elsewhere classified, initial encounter: Secondary | ICD-10-CM | POA: Insufficient documentation

## 2023-10-30 DIAGNOSIS — Z23 Encounter for immunization: Secondary | ICD-10-CM | POA: Diagnosis not present

## 2023-10-30 DIAGNOSIS — S61209A Unspecified open wound of unspecified finger without damage to nail, initial encounter: Secondary | ICD-10-CM

## 2023-10-30 MED ORDER — CEPHALEXIN 500 MG PO CAPS: 1000.0000 mg | ORAL_CAPSULE | Freq: Two times a day (BID) | ORAL | 0 refills | Status: AC

## 2023-10-30 MED ORDER — TETANUS-DIPHTH-ACELL PERTUSSIS 5-2.5-18.5 LF-MCG/0.5 IM SUSY
0.5000 mL | PREFILLED_SYRINGE | Freq: Once | INTRAMUSCULAR | Status: AC
  Administered 2023-10-30: 0.5 mL via INTRAMUSCULAR
  Filled 2023-10-30: qty 0.5

## 2023-10-30 MED ORDER — CEPHALEXIN 500 MG PO CAPS: 1000.0000 mg | ORAL_CAPSULE | Freq: Two times a day (BID) | ORAL | 0 refills | Status: DC

## 2023-10-30 NOTE — Discharge Instructions (Addendum)
Today you were seen for a finger laceration.  Please pick up your antibiotic and take as prescribed.  You may alternate taking Tylenol and Motrin as needed for pain.  Please do not take Motrin for greater than 5 days neurosis may cause rebound headaches.  Thank you for letting us treat you today. After performing a physical exam and reviewing your imaging, I feel you are safe to go home. Please follow up with your PCP in the next several days and provide them with your records from this visit. Return to the Emergency Room if pain becomes severe or symptoms worsen.

## 2023-10-30 NOTE — ED Triage Notes (Signed)
Pt via pov from home with laceration to tip of thumb. Pt cut it with a paper cutter; actively bleeding during triage. Pt alert & oriented, nad noted.

## 2023-10-30 NOTE — ED Notes (Signed)
Pt alert and oriented X 4 at the time of discharge. RR even and unlabored. No acute distress noted. Pt verbalized understanding of discharge instructions as discussed. Pt ambulatory to lobby at time of discharge.

## 2023-10-30 NOTE — ED Provider Notes (Addendum)
[place] [facility] AT [facility] Provider Note   CSN: [id number] Arrival date & time: 10/30/23  1818     History  Chief Complaint  Patient presents with   Extremity Laceration    [name] [name] is a 52 y.o. female presents today after cutting her left thumb with a paper cutter.  Patient endorses bleeding and mild pain.  Patient denies numbness, weakness, decreased range of motion.  Patient is unsure when her last Tdap was.  HPI     Home Medications Prior to Admission medications   Medication Sig Start Date End Date Taking? Authorizing Provider  cephALEXin (KEFLEX) 500 MG capsule Take 2 capsules (1,000 mg total) by mouth 2 (two) times daily for 5 days. 10/30/23 11/04/23 Yes Dolphus Jenny, PA-C  fish oil-omega-3 fatty acids 1000 MG capsule Take 1 g by mouth daily.     [provider]  levonorgestrel (MIRENA) 20 MCG/24HR IUD 1 each by Intrauterine route once.    [provider]  Prenatal Vit-Fe Fumarate-FA (PRENATAL MULTIVITAMIN) TABS Take 1 tablet by mouth daily.    [provider]      Allergies    Patient has no known allergies.    Review of Systems   Review of Systems  Skin:  Positive for wound.    Physical Exam Updated Vital Signs BP (!) 142/112   Pulse 98   Temp 98.3 F (36.8 C) (Oral)   Resp 18   Ht 5\' 6"  (1.676 m)   Wt 78.5 kg   SpO2 97%   BMI 27.92 kg/m  Physical Exam Vitals and nursing note reviewed.  Constitutional:      General: She is not in acute distress.    Appearance: She is well-developed.  HENT:     Head: Normocephalic and atraumatic.  Eyes:     Conjunctiva/sclera: Conjunctivae normal.  Cardiovascular:     Rate and Rhythm: Normal rate and regular rhythm.     Heart sounds: No murmur heard. Pulmonary:     Effort: Pulmonary effort is normal. No respiratory distress.     Breath sounds: Normal breath sounds.  Abdominal:     Palpations: Abdomen is soft.     Tenderness: There is no abdominal  tenderness.  Musculoskeletal:        General: Signs of injury present. No swelling.     Cervical back: Neck supple.     Comments: Left thumb avulsion injury involving nailbed.  Thumb is actively oozing upon physical exam.  Patient is neurovascularly intact.  Skin:    General: Skin is warm and dry.     Capillary Refill: Capillary refill takes less than 2 seconds.  Neurological:     Mental Status: She is alert.  Psychiatric:        Mood and Affect: Mood normal.     ED Results / Procedures / Treatments   Labs (all labs ordered are listed, but only abnormal results are displayed) Labs Reviewed - No data to display  EKG None  Radiology DG Finger Thumb Left  Result Date: 10/30/2023 CLINICAL DATA:  Thumb laceration with a paper cutter. EXAM: LEFT THUMB 2+V COMPARISON:  None Available. FINDINGS: Distal left thumb soft tissue irregularity and overlying bandage material. No fracture or radiopaque foreign body seen. Mild 1st IP joint degenerative spur formation. Mild 1st metacarpal/carpal degenerative changes. IMPRESSION: Distal left thumb soft tissue laceration. No fracture or radiopaque foreign body. Electronically Signed   By: Beckie Salts M.D.   On: 10/30/2023 20:55  Procedures Procedures    Medications Ordered in ED Medications  Tdap (BOOSTRIX) injection 0.5 mL (0.5 mLs Intramuscular Given 10/30/23 2012)    ED Course/ Medical Decision Making/ A&P                                 Medical Decision Making Amount and/or Complexity of Data Reviewed Radiology: ordered.  Risk Prescription drug management.   This patient presents to the ED with chief complaint(s) of finger laceration with pertinent past medical history of none which further complicates the presenting complaint. The complaint involves an extensive differential diagnosis and also carries with it a high risk of complications and morbidity.    The differential diagnosis includes phalanx fracture, finger  laceration  ED Course and Reassessment: Hemostasis achieved using combat gauze and pressure.  Wound thoroughly irrigated and move through full range of motion.  Independent visualization of imaging: - I independently visualized the following imaging with scope of interpretation limited to determining acute life threatening conditions related to emergency care: Left thumb x-ray, which revealed soft tissue laceration, no fracture or radiopaque foreign body  Consultation: - Consulted or discussed management/test interpretation w/ external professional: None  Consideration for admission or further workup: Considered for admission or further workup however patient's vital signs, physical exam, and imaging have all been reassuring.  Patient will be given short course of outpatient antibiotics.  Patient should follow-up with PCP for further evaluation and treatment        Final Clinical Impression(s) / ED Diagnoses Final diagnoses:  Open wound of finger, initial encounter    Rx / DC Orders ED Discharge Orders          Ordered    cephALEXin (KEFLEX) 500 MG capsule  2 times daily        10/30/23 2218              Dolphus Jenny, PA-C 10/30/23 2220    Dolphus Jenny, PA-C 10/30/23 2222    Rondel Baton, MD 11/01/23 [phone]

## 2024-01-03 ENCOUNTER — Other Ambulatory Visit (HOSPITAL_BASED_OUTPATIENT_CLINIC_OR_DEPARTMENT_OTHER): Payer: Self-pay

## 2024-01-03 ENCOUNTER — Other Ambulatory Visit: Payer: Self-pay

## 2024-01-03 MED ORDER — AMPHETAMINE-DEXTROAMPHET ER 5 MG PO CP24
5.0000 mg | ORAL_CAPSULE | Freq: Every day | ORAL | 0 refills | Status: DC
  Filled 2024-01-03: qty 30, 30d supply, fill #0

## 2024-01-06 ENCOUNTER — Other Ambulatory Visit (HOSPITAL_BASED_OUTPATIENT_CLINIC_OR_DEPARTMENT_OTHER): Payer: Self-pay

## 2024-01-28 DIAGNOSIS — M25531 Pain in right wrist: Secondary | ICD-10-CM | POA: Insufficient documentation

## 2024-01-30 ENCOUNTER — Other Ambulatory Visit (HOSPITAL_BASED_OUTPATIENT_CLINIC_OR_DEPARTMENT_OTHER): Payer: Self-pay

## 2024-02-03 ENCOUNTER — Other Ambulatory Visit (HOSPITAL_BASED_OUTPATIENT_CLINIC_OR_DEPARTMENT_OTHER): Payer: Self-pay

## 2024-02-04 ENCOUNTER — Other Ambulatory Visit (HOSPITAL_BASED_OUTPATIENT_CLINIC_OR_DEPARTMENT_OTHER): Payer: Self-pay

## 2024-02-05 ENCOUNTER — Other Ambulatory Visit (HOSPITAL_BASED_OUTPATIENT_CLINIC_OR_DEPARTMENT_OTHER): Payer: Self-pay

## 2024-02-08 ENCOUNTER — Other Ambulatory Visit (HOSPITAL_BASED_OUTPATIENT_CLINIC_OR_DEPARTMENT_OTHER): Payer: Self-pay

## 2024-02-08 ENCOUNTER — Encounter (HOSPITAL_BASED_OUTPATIENT_CLINIC_OR_DEPARTMENT_OTHER): Payer: Self-pay

## 2024-02-19 ENCOUNTER — Other Ambulatory Visit (HOSPITAL_BASED_OUTPATIENT_CLINIC_OR_DEPARTMENT_OTHER): Payer: Self-pay

## 2024-02-19 MED ORDER — LISDEXAMFETAMINE DIMESYLATE 30 MG PO CAPS
30.0000 mg | ORAL_CAPSULE | Freq: Every day | ORAL | 0 refills | Status: DC
  Filled 2024-02-19: qty 30, 30d supply, fill #0

## 2024-02-21 ENCOUNTER — Other Ambulatory Visit (HOSPITAL_BASED_OUTPATIENT_CLINIC_OR_DEPARTMENT_OTHER): Payer: Self-pay

## 2024-03-09 ENCOUNTER — Other Ambulatory Visit (HOSPITAL_BASED_OUTPATIENT_CLINIC_OR_DEPARTMENT_OTHER): Payer: Self-pay

## 2024-03-25 ENCOUNTER — Other Ambulatory Visit (HOSPITAL_BASED_OUTPATIENT_CLINIC_OR_DEPARTMENT_OTHER): Payer: Self-pay

## 2024-04-20 ENCOUNTER — Other Ambulatory Visit (HOSPITAL_BASED_OUTPATIENT_CLINIC_OR_DEPARTMENT_OTHER): Payer: Self-pay

## 2024-04-23 ENCOUNTER — Other Ambulatory Visit (HOSPITAL_BASED_OUTPATIENT_CLINIC_OR_DEPARTMENT_OTHER): Payer: Self-pay

## 2024-04-23 ENCOUNTER — Encounter (HOSPITAL_BASED_OUTPATIENT_CLINIC_OR_DEPARTMENT_OTHER): Payer: Self-pay

## 2024-05-13 ENCOUNTER — Other Ambulatory Visit (HOSPITAL_BASED_OUTPATIENT_CLINIC_OR_DEPARTMENT_OTHER): Payer: Self-pay

## 2024-05-13 MED ORDER — LISDEXAMFETAMINE DIMESYLATE 30 MG PO CAPS
30.0000 mg | ORAL_CAPSULE | Freq: Every day | ORAL | 0 refills | Status: DC
  Filled 2024-05-13: qty 30, 30d supply, fill #0

## 2024-05-14 ENCOUNTER — Other Ambulatory Visit (HOSPITAL_BASED_OUTPATIENT_CLINIC_OR_DEPARTMENT_OTHER): Payer: Self-pay

## 2024-05-20 ENCOUNTER — Ambulatory Visit
Admission: RE | Admit: 2024-05-20 | Discharge: 2024-05-20 | Disposition: A | Discharge status: Home / Self Care | Source: Ambulatory Visit | Attending: Family Medicine | Admitting: Family Medicine

## 2024-05-20 DIAGNOSIS — Z1231 Encounter for screening mammogram for malignant neoplasm of breast: Secondary | ICD-10-CM

## 2024-05-28 ENCOUNTER — Other Ambulatory Visit (HOSPITAL_BASED_OUTPATIENT_CLINIC_OR_DEPARTMENT_OTHER): Payer: Self-pay

## 2024-07-15 ENCOUNTER — Ambulatory Visit: Payer: Self-pay | Admitting: Family Medicine

## 2024-07-31 ENCOUNTER — Other Ambulatory Visit (HOSPITAL_BASED_OUTPATIENT_CLINIC_OR_DEPARTMENT_OTHER): Payer: Self-pay

## 2024-07-31 MED ORDER — LISDEXAMFETAMINE DIMESYLATE 30 MG PO CAPS
30.0000 mg | ORAL_CAPSULE | Freq: Every day | ORAL | 0 refills | Status: AC
  Filled 2024-07-31: qty 30, 30d supply, fill #0

## 2024-08-10 ENCOUNTER — Other Ambulatory Visit (HOSPITAL_BASED_OUTPATIENT_CLINIC_OR_DEPARTMENT_OTHER): Payer: Self-pay

## 2024-12-07 ENCOUNTER — Encounter: Payer: Self-pay | Admitting: Family

## 2024-12-07 ENCOUNTER — Ambulatory Visit: Admitting: Family

## 2024-12-07 VITALS — BP 128/84 | HR 84 | Temp 97.0°F | Ht 66.0 in | Wt 171.5 lb

## 2024-12-07 DIAGNOSIS — F3341 Major depressive disorder, recurrent, in partial remission: Secondary | ICD-10-CM | POA: Insufficient documentation

## 2024-12-07 DIAGNOSIS — E7800 Pure hypercholesterolemia, unspecified: Secondary | ICD-10-CM | POA: Insufficient documentation

## 2024-12-07 DIAGNOSIS — F411 Generalized anxiety disorder: Secondary | ICD-10-CM | POA: Insufficient documentation

## 2024-12-07 DIAGNOSIS — Z23 Encounter for immunization: Secondary | ICD-10-CM

## 2024-12-07 DIAGNOSIS — Z79890 Hormone replacement therapy (postmenopausal): Secondary | ICD-10-CM | POA: Insufficient documentation

## 2024-12-07 DIAGNOSIS — F900 Attention-deficit hyperactivity disorder, predominantly inattentive type: Secondary | ICD-10-CM | POA: Insufficient documentation

## 2024-12-07 DIAGNOSIS — Z6831 Body mass index (BMI) 31.0-31.9, adult: Secondary | ICD-10-CM | POA: Insufficient documentation

## 2024-12-07 DIAGNOSIS — I10 Essential (primary) hypertension: Secondary | ICD-10-CM | POA: Diagnosis not present

## 2024-12-07 DIAGNOSIS — E782 Mixed hyperlipidemia: Secondary | ICD-10-CM | POA: Insufficient documentation

## 2024-12-07 DIAGNOSIS — N951 Menopausal and female climacteric states: Secondary | ICD-10-CM | POA: Insufficient documentation

## 2024-12-07 MED ORDER — ESTRADIOL 0.5 MG PO TABS: 0.5000 mg | ORAL_TABLET | Freq: Every day | ORAL | 2 refills | Status: AC

## 2024-12-07 NOTE — Patient Instructions (Signed)
 Welcome to [facility], It was a pleasure meeting you today!    As discussed, I have sent your new Estradiol  pill to your pharmacy. Stop taking the patch and continue the Progesterone at night.   Please schedule a 1 month follow up visit today, ok if combined with a physical (and fasting labs).    PLEASE NOTE: If you had any LAB tests please let us  know if you have not heard back within a few days. You may see your results on MyChart before we have a chance to review them but we will give you a call once they are reviewed by us . If we ordered any REFERRALS today, please let us  know if you have not heard from their office within the next week.  Let us  know through MyChart if you are needing REFILLS, or have your pharmacy send us  the request. You can also use MyChart to communicate with me or any office staff.  Please try these tips to maintain a healthy lifestyle: It is important that you exercise regularly at least 30 minutes 5 times a week. Think about what you will eat, plan ahead. Choose whole foods, & think  clean, green, fresh or frozen over canned, processed or packaged foods which are more sugary, salty, and fatty. 70 to 75% of food eaten should be fresh vegetables and protein. 2-3  meals daily with healthy snacks between meals, but must be whole fruit, protein or vegetables. Aim to eat over a 10 hour period when you are active, for example, 7am to 5pm, and then STOP after your last meal of the day, drinking only water.  Shorter eating windows, 6-8 hours, are showing benefits in heart disease and blood sugar regulation. Drink water every day! Shoot for 64 ounces daily = 8 cups, no other drink is as healthy! Fruit juice is best enjoyed in a healthy way, by EATING the fruit.

## 2024-12-07 NOTE — Progress Notes (Signed)
 "  New Patient Office Visit  Subjective:  Patient ID: [name] [name], female    DOB: 1971/03/08  Age: 54 y.o. MRN: [id number]  CC:  Chief Complaint  Patient presents with   New Patient (Initial Visit)   HPI [name] [name] presents for establishing care today. Discussed the use of AI scribe software for clinical note transcription with the patient, who gave verbal consent to proceed.  History of Present Illness [name] [name] is a 54 year old female with hypertension who presents for establishment of care and management of elevated blood pressure.  She noticed elevated blood pressure beginning around December, with partial improvement on recheck but persistent elevation. She links this to increased stress from caregiving for her son after a tibia fracture and her work as a museum/gallery exhibitions officer. She started Vyvanse  within the past two years, and her blood pressure issues began around the same time. She currently takes Vyvanse  30 mg most days but has only taken it twice recently. She uses hydroxyzine 25 mg at night as needed for anxiety. She is on hormone replacement therapy with an estrogen patch and progesterone 100 mg nightly, which improved sleep and helped night sweats and mood swings. She feels tired, which she attributes to her busy and stressful lifestyle. She denies current headaches. Her family history is not notable for clots or breast cancer in any immediate family members.  Assessment & Plan Immunization due - Flu vaccine trivalent PF, 6mos and older(Flulaval,Afluria,Fluarix,Fluzone) - Pneumococcal conjugate vaccine 20-valent (Prevnar 20)  Essential hypertension Blood pressure elevated, possibly due to stress and Vyvanse . Discussed lifestyle modifications and potential antihypertensive medication. Consider reducing Vyvanse  dose. Has Hydroxyzine but current dose causes too much drowsiness. BP better on recheck at end of visit. - Recommended lifestyle modifications: stress management,  weight control, exercise, and dietary changes to keep BP under control. - Consider starting low-dose antihypertensive medication if lifestyle changes are insufficient and is elevated again. - Discussed reducing Vyvanse  dose to manage anxiety and blood pressure while maintaining focus. - F/U in 1 month  Generalized anxiety Increased lately due to son's injury and having to be caretaker. 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  - Advised to cut the Hydroxyzine to 1/2 pill = 12.5mg  and try during the day to reduce risk of drowsiness. - Encourage exercise, meditation, yoga to help lower anxiety. - F/U in 1 month  Attention-deficit hyperactivity disorder, predominantly inattentive type Vyvanse  30 mg may contribute to elevated blood pressure and anxiety. Discussed dose reduction and alternative anxiety management. - Consider reducing Vyvanse  dose to 20 mg to manage anxiety and blood pressure while maintaining focus. - Discuss with pharmacist about reducing Vyvanse  dose by emptying capsule. - F/U in 1 month  Postmenopausal hormone therapy Using estrogen patch and progesterone. Discussed switching to oral estrogen pills due to cost and preference. Discussed risks and benefits of hormone therapy. - Switched from estrogen patch to oral estrogen pills at the lowest dose (0.5 mg). - Continue progesterone at night for sleep benefits. - Monitor for vaginal spotting and adjust progesterone dose if necessary. - Educated on consistent timing for progesterone to avoid spotting. - F/U in 1 month   Subjective:    Outpatient Medications Prior to Visit  Medication Sig Dispense Refill   Azelaic Acid 15 % gel APPLY TO FACE 1 TO 2 TIMES A DAY FOR ACNE AND ROSACEA     hydrOXYzine (VISTARIL) 25 MG capsule TAKE 1 CAPSULE BY MOUTH TWICE DAILY AS NEEDED     ibuprofen  (  ADVIL ) 200 MG tablet 1 tablet with food or milk as needed Orally Three times a day     levonorgestrel  (MIRENA ) 20 MCG/24HR IUD 1 each by  Intrauterine route once.     lisdexamfetamine  (VYVANSE ) 30 MG capsule Take 1 capsule (30 mg total) by mouth daily. 30 capsule 0   meloxicam (MOBIC) 7.5 MG tablet      progesterone (PROMETRIUM) 100 MG capsule Take 100 mg by mouth daily.     tretinoin (RETIN-A) 0.025 % cream APPLY PEA-SIZE AMOUNT TO FACE AT BEDTIME     fish oil-omega-3 fatty acids 1000 MG capsule Take 1 g by mouth daily.      LYLLANA  0.05 MG/24HR patch APPLY 1 PATCH TOPICALLY TWICE A WEEK     Prenatal Vit-Fe Fumarate-FA (PRENATAL MULTIVITAMIN) TABS Take 1 tablet by mouth daily.     amphetamine -dextroamphetamine  (ADDERALL XR) 5 MG 24 hr capsule Take 1 capsule (5 mg total) by mouth daily. 30 capsule 0   No facility-administered medications prior to visit.   Past Medical History:  Diagnosis Date   Abnormal Pap smear 11/19/1989   LEEP   AMA (advanced maternal age) multigravida 35+    Attention deficit hyperactivity disorder (ADHD), predominantly inattentive type 12/07/2024   Bacterial infection    Body mass index (BMI) 31.0-31.9, adult 12/07/2024   Condylomata acuminata in female    Generalized anxiety disorder 12/07/2024   H/O rubella    H/O varicella    HPV in female    Hx: UTI (urinary tract infection)    occasionally   Infertility, female    LGSIL (low grade squamous intraepithelial dysplasia)    Menopausal symptom 12/07/2024   Pure hypercholesterolemia 12/07/2024   Recurrent major depressive disorder, in partial remission 12/07/2024   Right wrist pain 01/28/2024   SUI (stress urinary incontinence, female) 03/29/2022   Yeast infection    Past Surgical History:  Procedure Laterality Date   CESAREAN SECTION     DILATION AND CURETTAGE OF UTERUS     INTRAUTERINE DEVICE INSERTION  01/28/2017   Mirena    LEEP     WISDOM TOOTH EXTRACTION     x 4    Objective:   Today's Vitals: BP 128/84 (BP Location: Left Arm, Patient Position: Sitting, Cuff Size: Normal)   Pulse 84   Temp (!) 97 F (36.1 C) (Temporal)   Ht  5' 6 (1.676 m)   Wt 171 lb 8 oz (77.8 kg)   SpO2 95%   Breastfeeding No   BMI 27.68 kg/m   Physical Exam Vitals and nursing note reviewed.  Constitutional:      Appearance: Normal appearance.  Cardiovascular:     Rate and Rhythm: Normal rate and regular rhythm.  Pulmonary:     Effort: Pulmonary effort is normal.     Breath sounds: Normal breath sounds.  Musculoskeletal:        General: Normal range of motion.  Skin:    General: Skin is warm and dry.  Neurological:     Mental Status: She is alert.  Psychiatric:        Mood and Affect: Mood normal.        Behavior: Behavior normal.    Meds ordered this encounter  Medications   estradiol  (ESTRACE ) 0.5 MG tablet    Sig: Take 1 tablet (0.5 mg total) by mouth daily.    Dispense:  30 tablet    Refill:  2    D/C estrogen patches    Supervising Provider:   JODIE,  CAMILLE L [[id number]]   Lucius Krabbe, NP  "

## 2025-01-04 ENCOUNTER — Encounter: Admitting: Family
